# Patient Record
Sex: Female | Born: 1980 | Race: White | Hispanic: No | Marital: Single | Smoking: Never smoker
Health system: Southern US, Community
[De-identification: ages and names within clinical notes are randomized; demographics above are authoritative.]

## PROBLEM LIST (undated history)

## (undated) DIAGNOSIS — E039 Hypothyroidism, unspecified: Secondary | ICD-10-CM

## (undated) HISTORY — DX: Hypothyroidism, unspecified: E03.9

---
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
## 2009-02-23 ENCOUNTER — Ambulatory Visit: Payer: Self-pay | Admitting: Internal Medicine

## 2009-12-02 ENCOUNTER — Encounter: Admission: RE | Admit: 2009-12-02 | Discharge: 2009-12-02 | Payer: Self-pay | Admitting: Podiatrist

## 2010-05-29 ENCOUNTER — Encounter: Payer: Self-pay | Admitting: Podiatry

## 2010-06-17 ENCOUNTER — Encounter: Payer: Self-pay | Admitting: Podiatry

## 2011-12-11 ENCOUNTER — Ambulatory Visit (INDEPENDENT_AMBULATORY_CARE_PROVIDER_SITE_OTHER): Payer: BC Managed Care – PPO | Admitting: Sports Medicine

## 2011-12-11 ENCOUNTER — Encounter: Payer: Self-pay | Admitting: Sports Medicine

## 2011-12-11 VITALS — BP 113/72 | HR 64 | Ht 65.0 in | Wt 140.0 lb

## 2011-12-11 DIAGNOSIS — M25579 Pain in unspecified ankle and joints of unspecified foot: Secondary | ICD-10-CM

## 2011-12-11 DIAGNOSIS — M25572 Pain in left ankle and joints of left foot: Secondary | ICD-10-CM | POA: Insufficient documentation

## 2011-12-11 NOTE — Progress Notes (Signed)
31 year old female coming in with left ankle pain.  Patient is new to our clinic. Patient has had this left ankle pain for over 2 years. Patient states it is left-sided and appears to be on the anterior aspect of the ankle. Patient does not remember any specific injury to the area. Patient has seen multiple specialists for this previously. Patient had an MRI done September 2011. Patient's MRI was reviewed and showed that she had a mild tendinopathy over the anterior tibialis tendon otherwise unremarkable study. Patient since that time had been in physical therapy with moderate benefit, as well as putting custom orthotics. Patient states though since all these interventions, she has not had as much improvement she would like. Patient states she still unable to run and now even with her regular activities during the day she has difficulties. Patient denies that the pain is waking her up at night, denies any radiation of pain denies any knee pain. Patient also denies any numbness.    Review of systems as stated above in history of present illness otherwise 14 system review is done and unremarkable Past medical, surgical history is unremarkable. Social history: Patient does not smoke occasional alcohol. Allergies: Patient is allergic to penicillins Medications: No medications Family history: Patient's uncle on the maternal side has diabetes, and in her paternal grandfather had heart disease.  Physical exam Filed Vitals:   12/11/11 0905  BP: 113/72  Pulse: 64   General: No apparent distress alert and oriented x3 mood and affect are normal. Skin: Patient's warm intact no rash noted. Neuro: Patient is neurovascularly intact in all extremities and has 2+ DTRs. Left foot exam: On inspection patient does have swelling nonpitting over the anterior ankle. Patient has minimal tenderness to palpation over the anterior tibialis tendon as well as just laterally. Patient has no masses are palpated at this time.  Patient is neurovascularly intact distally. Patient has no pain over the medial or lateral malleolus and has full range of motion of the ankle. Patient has no pain over the fifth MTP or navicular bone. With standing patient's foot exam does show that she has some mild varus deformity of the hindfeet bilaterally right greater than left With walking patient does have some mild overpronation patient though with jogging does strike fairly neutral and is mostly midfoot runner.  Ultrasound was done and interpreted by me today. On ultrasound patient's anterior tibialis does show some hypoechoic changes surrounding the tendon near insertion as well as up to 2 cm proximal. Patient also has a pocket of fluid and seems to be surrounding the retinaculum and just lateral to the anterior tibialis. No true tear can be seen in the retinaculum. Patient also has some hypoechoic changes surrounding the extensor digitorum tendon.

## 2011-12-11 NOTE — Assessment & Plan Note (Addendum)
Patient on exam has what appears to be a anterior tibialis tendinopathy as well as likely a retinaculum injury. Patient does not have any true tears but does have hypoechoic changes on ultrasound and minimal swelling noted on physical exam. Patient will come out of the hard custom orthotics that she has and was put into sports insoles. Patient's hard custom orthotics also had a heel lift which I think is contributing to the worsening pain. In addition this patient was given a body helix compression sleeve for her left ankle. Patient is unaware this with any increase in activity. Patient given home exercise program to do a regular basis that includes acentric exercises for the ankle and calf. Patient can use anti-inflammatories over-the-counter as needed for pain control. Patient is also given a walk/jog protocol to help increase her to get her back to running which is one of her goals. Patient come back in 6 weeks' time for further evaluation. At that point she still is having some pain we should consider potentially doing a nitroglycerin patch.

## 2012-01-22 ENCOUNTER — Ambulatory Visit (INDEPENDENT_AMBULATORY_CARE_PROVIDER_SITE_OTHER): Payer: BC Managed Care – PPO | Admitting: Sports Medicine

## 2012-01-22 ENCOUNTER — Encounter: Payer: Self-pay | Admitting: Sports Medicine

## 2012-01-22 VITALS — BP 103/68 | HR 77

## 2012-01-22 DIAGNOSIS — M25572 Pain in left ankle and joints of left foot: Secondary | ICD-10-CM

## 2012-01-22 DIAGNOSIS — M25579 Pain in unspecified ankle and joints of unspecified foot: Secondary | ICD-10-CM

## 2012-01-22 MED ORDER — NITROGLYCERIN 0.2 MG/HR TD PT24: MEDICATED_PATCH | TRANSDERMAL | Status: DC

## 2012-01-22 NOTE — Patient Instructions (Addendum)
I am glad you are doing better.  We are going the right direction.  Continue to wear the compression when you can.  More important to wear it 2 hours after activity.  We will try the nitro patch.  Wear a quarter patch daily in the area of pain.  Be aware could give you headache and maybe a rash.  Try this and come back in 6 weeks.

## 2012-01-22 NOTE — Progress Notes (Signed)
Patient returns today for followup of her ankle pain. Patient at last visit did have an anterior tibialis tendinopathy as well as her retinaculum injury to the left ankle. Patient was given a compression sleeve, exercises and stretches to do, and a running protocol to increase her activity. Patient states that since that time she is about 50-60% better. She continues to improve but still has some soreness at the end of activity. Patient states that she is not having any swelling any more. Patient is not taking any over-the-counter pain medications. Patient denies any numbness or any new symptoms. Patient denies any nighttime awakenings. Patient is happy with the results of far but is frustrated because she would like to be completely pain-free.  No changes in past medical surgical family and social history.  14 system review is done and unremarkable to the orthopedic problem  Physical exam Blood pressure 103/68, pulse 77, last menstrual period 01/12/2012. General: No apparent distress alert and oriented x3 mood and affect normal Left foot exam: On inspection patient has no swelling. Patient has minimal tenderness to palpation over the anterior tibialis tendon as well as just laterally but improved since last visit. Patient's anterior tibialis tendon is now able to be seen in greater detail secondary to decreased effusion and this does appear to be hypertrophic compared to the contralateral side. Patient has no masses are palpated at this time. Patient is neurovascularly intact distally.  Patient has no pain over the medial or lateral malleolus and has full range of motion of the ankle. Patient has no pain over the fifth MTP or navicular bone.     Ultrasound was done and interpreted by me today. On ultrasound patient's anterior tibialis does show some hypoechoic changes surrounding the tendon near insertion as well as up to 2 cm proximal but significantly less than last time.. Patient also has a pocket  of fluid and seems to be surrounding the retinaculum. No true tear can be seen in the retinaculum. No fluid around the extensor digitorum. No true tears weren't neovascularization seen.

## 2012-01-22 NOTE — Assessment & Plan Note (Signed)
Patient has made some improvement at this time. Encourage her to continue to wear the compression sleeve as well as doing exercising on a regular basis. Patient though will be given a prescription for a nitroglycerin patch. Patient warned of potential side effects such as headache in contact dermatitis. Patient will try a quarter of patch daily until next followup. Patient will come back in 6 weeks' time for further evaluation and likely ultrasound to make sure we do have neovascularization. I am very optimistic at the time we see patient she should be about 80% her regular self.

## 2012-03-04 ENCOUNTER — Ambulatory Visit: Payer: BC Managed Care – PPO | Admitting: Sports Medicine

## 2012-04-08 ENCOUNTER — Ambulatory Visit (INDEPENDENT_AMBULATORY_CARE_PROVIDER_SITE_OTHER): Payer: BC Managed Care – PPO | Admitting: Sports Medicine

## 2012-04-08 VITALS — BP 115/77 | Ht 65.0 in | Wt 140.0 lb

## 2012-04-08 DIAGNOSIS — M25572 Pain in left ankle and joints of left foot: Secondary | ICD-10-CM

## 2012-04-08 DIAGNOSIS — M25579 Pain in unspecified ankle and joints of unspecified foot: Secondary | ICD-10-CM

## 2012-04-08 NOTE — Patient Instructions (Signed)
It is great to see you. I want you to increase her running per the protocol we gave you previously. Continue to wear the nitroglycerin patches but she can start going every other day if you would like. Continue to do the exercises at least 3 times a week. Come back in 6-8 weeks if you're not 100%.

## 2012-04-08 NOTE — Progress Notes (Signed)
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 Patient was last seen in November greater than 6 weeks ago. Patient states since that time she continues to improve and is approximately 80% better. Patient states that she has been doing a lot of traveling a lot walking and had no pain.  Patient has been wearing a nitroglycerin patch and can only wear it for 12 hours otherwise she starts to have itching in the area. Patient states though she has not had any headaches and feels like it hasn't helped out. Patient has been doing the exercises on a near daily basis and continues to wear the compression sleeve after exercising. Patient does not wear the compression sleeve during exercise secondary to some rubbing on the posterior aspect of her heel.   Patient has not been doing full running at this time but has started the protocol running for one to 2 minutes at a time and then walking for 4 and states that she has no significant pain.  Review of systems is done and unremarkable as related to the orthopedic problem.  Physical exam Blood pressure 115/77, height 5\' 5"  (1.651 m), weight 140 lb (63.504 kg). General: No apparent distress alert and oriented x3 Left foot exam: Patient has no gross deformity no swelling on inspection. Patient is nontender over the anterior tibialis tendon. Patient has full range of motion of the ankle in 5 out of 5 strength. She is neurovascularly intact distally. Patient has no pain over the fifth MTP or navicular bone. Patient has no pain over the lateral or medial malleolus either.  Ultrasound was done and interpreted by me today. On ultrasound patient anterior tibialis has minimal hypoechoic changes in patient's previous care is completely healed in the tendon as well as the retinaculum. This is significantly better than previous exam.

## 2012-04-08 NOTE — Assessment & Plan Note (Signed)
Patient at this time is completely healed and can increase her training. Discussed proper protocol to increase regimen slowly. He Patient has been on a nitroglycerin patch for the last 2 months and will come off of it over the course of the next 2-4 weeks. Patient will also continue to do maintenance therapy for the ankle with doing exercises at least 3 times a week. Patient and will followup in 8 weeks if for some reason she is not able to be at full-strength at that time.

## 2012-05-27 ENCOUNTER — Ambulatory Visit (INDEPENDENT_AMBULATORY_CARE_PROVIDER_SITE_OTHER): Payer: BC Managed Care – PPO | Admitting: Sports Medicine

## 2012-05-27 VITALS — BP 112/70 | Ht 65.0 in | Wt 140.0 lb

## 2012-05-27 DIAGNOSIS — M25579 Pain in unspecified ankle and joints of unspecified foot: Secondary | ICD-10-CM

## 2012-05-27 DIAGNOSIS — S46392A Other injury of muscle, fascia and tendon of triceps, left arm, initial encounter: Secondary | ICD-10-CM | POA: Insufficient documentation

## 2012-05-27 DIAGNOSIS — S4980XA Other specified injuries of shoulder and upper arm, unspecified arm, initial encounter: Secondary | ICD-10-CM

## 2012-05-27 DIAGNOSIS — M25572 Pain in left ankle and joints of left foot: Secondary | ICD-10-CM

## 2012-05-27 NOTE — Progress Notes (Signed)
Chief complaint: Left ankle pain and left elbow pain  History of present illness: The patient is here for followup of her left ankle. Patient did have anterior tibialis tendinopathy. Patient has been doing nitroglycerin patches, compression sleeve, icing and home exercise program. Patient states that she is doing significantly better at this time. Patient would say she is approximately 95% better. Patient states that she has no pain with any activity but it usually is the day afterwards when she feels discomfort. Patient denies any numbness tingling or any nighttime awakening. Patient is very happy with her progress so far.  Patient is coming in with a new problem  of left elbow pain. Patient states approximately one month ago she started doing some kettlebell exercises. Patient states that she's been having some pain in the posterior aspect of her elbow.  Patient's sister who is a physical therapist said it could be tennis elbow. Patient has not tried any thing differently except using a tennis elbow air strap but has started to change her training protocol. Patient notices that pushups seem to hurt a lot and if she sits with her arms in raised position such as at her desk for a long amount of time she can have some soreness in this area. Patient denies any swelling any bruising in the area. The patient denies any clicking popping or locking on her. Patient denies any radiation of pain or any numbness. Patient describes the pain more as a dull aching sensation.  Past medical history, social, surgical and family history all reviewed.   14 System review was done and unremarkable as related to the chief complaint.   Physical exam Blood pressure 112/70, height 5\' 5"  (1.651 m), weight 140 lb (63.504 kg). General: No apparent distress alert and oriented x3 Left foot exam: Patient has no gross deformity no swelling on inspection. Patient is nontender over the anterior tibialis tendon. Patient has full range  of motion of the ankle in 5 out of 5 strength. She is neurovascularly intact distally. Patient has no pain over the fifth MTP or navicular bone. Patient has no pain over the lateral or medial malleolus either.  Left elbow exam: Patient has mild tenderness to palpation over the posterior aspect of the elbow at the insertion of the triceps. There is no gapping palpated or any type of a fusion. Patient has full range of motion and full 5 out of 5 strength. She is neurovascularly intact distally with  full grip strength.  Musculoskeletal ultrasound was performed interpreted by me today. Patient's ultrasound did show some hypoechoic changes with mild neovascularization of the tendon of the long head of the triceps. There is no true tearing and only sees more of this hypoechoic change between the 3 muscle heads secondary to be fluid. There was no dynamic gapping on range of motion. Some increased blood flow in region.

## 2012-05-27 NOTE — Assessment & Plan Note (Addendum)
The patient has what appears to be splitting between the heads of the tricep. I do not think she has a true tear of the triceps tendon but legitimately get one if we push it too hard. The patient was given a home exercise program focusing on triceps extensions with weights between 2 and 5 pounds in multiple directions. Told her to avoid full body weight exercises such as pushups or body dips. Patient given a compression sleeve today and will try the nitroglycerin patch over the area. Patient return again in 6 weeks for further evaluation.

## 2012-05-27 NOTE — Assessment & Plan Note (Signed)
Seems to be completely resolved at this time. Encouraged patient to continue the compression sleeve.  Continue doing home exercise program 3 times a week. Encouraged patient to continue to cross train though and try to avoid too much running at any one time. At this point patient is cleared to do any activity she would like and can followup as needed for this problem.

## 2012-05-27 NOTE — Patient Instructions (Signed)
Good to see you We want you to try to use the nitro patch on your tricep tendon.   Try doing kickback with 3-5# weights in neutral, underhand and overhand positions Also do modified push ups but avoid using your full body weight. Try the compression sleeve at least after activity.  Icing 20 minutes 3 times a day.  Lets have you come back again in 6 weeks to make sure you doing doing.

## 2012-07-09 ENCOUNTER — Encounter: Payer: Self-pay | Admitting: Sports Medicine

## 2012-07-09 ENCOUNTER — Ambulatory Visit (INDEPENDENT_AMBULATORY_CARE_PROVIDER_SITE_OTHER): Payer: BC Managed Care – PPO | Admitting: Sports Medicine

## 2012-07-09 VITALS — BP 112/69 | HR 80 | Ht 65.0 in | Wt 140.0 lb

## 2012-07-09 DIAGNOSIS — M25572 Pain in left ankle and joints of left foot: Secondary | ICD-10-CM

## 2012-07-09 DIAGNOSIS — S46392A Other injury of muscle, fascia and tendon of triceps, left arm, initial encounter: Secondary | ICD-10-CM

## 2012-07-09 DIAGNOSIS — M25579 Pain in unspecified ankle and joints of unspecified foot: Secondary | ICD-10-CM

## 2012-07-09 DIAGNOSIS — S4980XA Other specified injuries of shoulder and upper arm, unspecified arm, initial encounter: Secondary | ICD-10-CM

## 2012-07-09 NOTE — Assessment & Plan Note (Signed)
Insertional triceps tendinitis much improved.  Advance activities as tolerated

## 2012-07-09 NOTE — Assessment & Plan Note (Signed)
Some posterior tibialis tendinopathy.  No sign of stress fracture Plan: Continue compression. Advance running as noted in instruction.  Followup as needed

## 2012-07-09 NOTE — Progress Notes (Signed)
Tammy [name] is a 32 y.o. female who presents to [facility] today for followup left ankle and left elbow.  Left elbow: Patient was seen approximately one month ago for injury to her triceps insertion in her left olecranon. This occurred while doing kettlebell exercises. In the interim she has been using resistance training compression ice. She has very little elbow pain. She has not yet started doing heavy weight lifting yet but feels well otherwise.  Left ankle: Initially diagnosed with anterior tibialis and subsequently also posterior tibialis pain. This is done well with nitroglycerin protocol compression and graduated return to running. She is now running 4 miles walking 1 mile. Her goal is to be on to run a 5K races again. She notes some mild pain at the medial malleolus. Her pain is located approximately 2 cm proximal to the medial malleolus and 1 cm anterior to the posterior edge.   She notes mild pain following running. She denies any swelling or bruising. She feels well otherwise.    PMH reviewed.  History  Substance Use Topics  . Smoking status: Never Smoker   . Smokeless tobacco: Never Used  . Alcohol Use: Not on file   ROS as above otherwise neg   Exam:  BP 112/69  Pulse 80  Ht 5\' 5"  (1.651 m)  Wt 140 lb (63.504 kg)  BMI 23.3 kg/m2 Gen: Well NAD MSK: Left elbow: Normal-appearing nontender. Normal range of motion and strength. Pulses capillary refill sensation intact distally Left ankle: Normal-appearing no swelling or ecchymosis. Mildly tender just superior to the medial malleolus. Normal motion strength. Normal capillary refill and pulses distally.   Limited musculoskeletal ultrasound of the left ankle: Mild fluid surrounding the posterior tibialis and flexor digitorum tendons at the corner of the malleolus. Just superior at the start of the posterior tibialis muscle belly there is some fluid tracking anteriorly across the distal tibia. There is no cortex irregularity or increased  Doppler activity indicating stress fracture.

## 2012-07-09 NOTE — Patient Instructions (Addendum)
Thank you for coming in today. Continue the exercises you have been doing.  Advance your run/walk program to 10:2 and then 15:2 and then 30:0 over a course of weeks.  Continue the elbow exercises.  If you go back to heavier weights use about 50% of the weight you were doing and advance slowly.  Return as needed as long as you are doing well.

## 2012-12-02 DIAGNOSIS — R140 Abdominal distension (gaseous): Secondary | ICD-10-CM | POA: Insufficient documentation

## 2013-09-23 ENCOUNTER — Encounter: Payer: Self-pay | Admitting: Podiatry

## 2013-10-04 ENCOUNTER — Encounter: Payer: Self-pay | Admitting: Podiatry

## 2013-10-04 ENCOUNTER — Ambulatory Visit (INDEPENDENT_AMBULATORY_CARE_PROVIDER_SITE_OTHER): Payer: BC Managed Care – PPO | Admitting: Podiatry

## 2013-10-04 VITALS — BP 113/72 | HR 65 | Resp 16 | Ht 65.0 in | Wt 150.0 lb

## 2013-10-04 DIAGNOSIS — Q828 Other specified congenital malformations of skin: Secondary | ICD-10-CM

## 2013-10-04 NOTE — Progress Notes (Signed)
She presents today chief complaint of painful lesions plantar aspect of the bilateral foot.  Objective: Vital signs are stable she is alert and oriented x3 pulses are palpable bilateral. Upon evaluation of the bilateral foot she does demonstrates solitary porokeratosis just proximal to the second metatarsal head bilaterally. There is no erythema edema cellulitis drainage or odor.  Assessment: Porokeratosis bilateral plantar foot.  Plan: Sharp debridement of these lesions today we'll followup with her as needed.

## 2013-10-05 ENCOUNTER — Telehealth: Payer: Self-pay | Admitting: *Deleted

## 2013-10-05 NOTE — Telephone Encounter (Signed)
Pt called stated she was in yesterday and she showed you a small raised place on the base of her middle toe rt foot. She wants to know if the place will go away on its own, is there anything that can be done with it or let it be?

## 2013-11-01 ENCOUNTER — Telehealth: Payer: Self-pay | Admitting: *Deleted

## 2013-11-01 NOTE — Telephone Encounter (Signed)
Called and left message on pts voicemail regarding a raised place on the base of her middle toe rt foot. Per dr Milinda Pointer: let it be, nothing to do about place on toe.

## 2014-01-12 ENCOUNTER — Ambulatory Visit: Payer: Self-pay | Admitting: General Practice

## 2014-02-16 ENCOUNTER — Encounter: Payer: Self-pay | Admitting: Nurse Practitioner

## 2014-03-18 ENCOUNTER — Encounter: Payer: Self-pay | Admitting: Nurse Practitioner

## 2014-04-18 ENCOUNTER — Encounter: Payer: Self-pay | Admitting: Nurse Practitioner

## 2014-05-17 ENCOUNTER — Encounter
Admit: 2014-05-17 | Disposition: A | Discharge status: Home / Self Care | Payer: Self-pay | Attending: Nurse Practitioner | Admitting: Nurse Practitioner

## 2014-06-17 ENCOUNTER — Encounter
Admit: 2014-06-17 | Disposition: A | Discharge status: Home / Self Care | Payer: Self-pay | Attending: Nurse Practitioner | Admitting: Nurse Practitioner

## 2014-08-04 ENCOUNTER — Ambulatory Visit (INDEPENDENT_AMBULATORY_CARE_PROVIDER_SITE_OTHER): Payer: BLUE CROSS/BLUE SHIELD | Admitting: Sports Medicine

## 2014-08-04 ENCOUNTER — Encounter: Payer: Self-pay | Admitting: Sports Medicine

## 2014-08-04 VITALS — BP 107/76 | Ht 65.0 in | Wt 160.0 lb

## 2014-08-04 DIAGNOSIS — M25572 Pain in left ankle and joints of left foot: Secondary | ICD-10-CM | POA: Diagnosis not present

## 2014-08-04 MED ORDER — AMITRIPTYLINE HCL 25 MG PO TABS: 25.0000 mg | ORAL_TABLET | Freq: Every day | ORAL | Status: DC

## 2014-08-04 NOTE — Assessment & Plan Note (Signed)
Unclear etiology of exact cause of her pain and swelling. 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  Concerns for potential CRPS/RSD given the intermittent and findings.  Also consider some underlying lymphatic abnormalities such as lymphedema praecox.  She has tolerated nitroglycerin in the past without significant improvement however Ultrasound does show significant subcutaneous edema with no focal abnormalities over the anterior tibialis, posterior tibialis or anterior talar dome. Start amitriptyline daily at bedtime Continue compression with body helix in consideration of compression socks. Follow up 4 weeks to see how she is doing.  > Consider additional arch support and cushioned insoles as her cushioned shoes are the most comfortable.  > Consider further evaluation of lumbar spine if no significant improvement

## 2014-08-04 NOTE — Progress Notes (Signed)
Tammy [name] - 34 y.o. female MRN [id number]  Date of birth: 04-22-1980  SUBJECTIVE: CC: 1.  left ankle pain and swelling, reevaluation      HPI:   long-standing history of significant pain and swelling. Present now for 2 months. Intermittently comes and goes. Swelling does seem to be worse at the end of the day. Typically only on the left leg. No right leg symptoms. Pain is focally located over the anterior joint line and over the anterior tibialis tendon. No skin changes or erythema associated with this. Denies fevers, chills or recent weight gain or weight loss.  His been previously thoroughly evaluated at podiatry here as well as by vascular specialist. Recommended to wear compression socks to help with some of the swelling she is having but no further intervention recommendations given.  As previously warned custom orthotics which were hard and quite uncomfortable for her.  Prior SCFE as a child and status post pinning of the left hip.      ROS:  per HPI    HISTORY:  Past Medical, Surgical, Social, and Family History reviewed & updated per EMR.  Pertinent Historical Findings include: Social History   Occupational History  . Not on file.   Social History Main Topics  . Smoking status: Never Smoker   . Smokeless tobacco: Never Used  . Alcohol Use: Yes     Comment: social  . Drug Use: No  . Sexual Activity: Not on file    No specialty comments available. Problem  Left Ankle Pain   Anterior tibialis tendinopathy. Questionable retinacular injury initially seen. Intermittent and long-lasting swelling of the entire left leg now. His been seen by vascular surgery and recommended to wear compression socks Using a body helix with some improvement but she seems to do well for prolonged periods of time and then will have an acute flareup that persists for months in spite of no significant changes in her activity level MRI left ankle, no contrast 12/02/2009: Mild tendinopathy and  tenosynovitis of the anterior tibialis. Otherwise ligamentously and tennis leg intact. No intra-articular findings noted.    OBJECTIVE:  VS:   HT:5\' 5"  (165.1 cm)   WT:160 lb (72.576 kg)  BMI:26.7          BP:107/76 mmHg  HR: bpm  TEMP: ( )  RESP:   PHYSICAL EXAM: GENERAL:  adult Caucasian female. No acute distress  PSYCH: Alert and appropriately interactive.  SKIN: No open skin lesions or abnormal skin markings on areas inspected as below  VASCULAR:  2+/4 pretibial pitting edema left, normal on the right. DP and PT pulses 2+/4. No significant pallor or skin changes.   NEURO: Lower extremity strength is 5+/5 in all myotomes; sensation is intact to light touch in all dermatomes.  LEFT ANKLE: Overall well aligned. No significant deformity. No marked tenderness to palpation today although slight tenderness over the anterior medial joint line/anterior tibialis tendon. No posterior tibialis tendon. Negative ankle drawer, talar tilt, Klieger, cotton testing. Squeeze test is normal. Dorsiflexion to 95. Small Morton's callus. LEFT HIP: Equal and symmetric internal and external range of motion. Extensor mechanism strength 5+, hip abduction strength 5 minus.  DATA OBTAINED: No notes on file    Limited MSK Ultrasound of left ankle: Findings:  marked subcutaneous edema. No significant changes within the anterior tibialis, posterior tibialis, talar dome, retinaculum. Small amount of supraphysiologic fluid around the anterior tibialis at the area of the retinaculum although only minimal. No tissue disruption, no increased neovascularity.  Impression: The above findings are consistent with subcutaneous edema with minimal anterior tibialis tendinopathy.      ASSESSMENT & PLAN: See problem based charting & AVS for additional documentation Problem List Items Addressed This Visit    Left ankle pain - Primary    Unclear etiology of exact cause of her pain and swelling. 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  Concerns for potential CRPS/RSD given the intermittent and findings.  Also consider some underlying lymphatic abnormalities such as lymphedema praecox.  She has tolerated nitroglycerin in the past without significant improvement however Ultrasound does show significant subcutaneous edema with no focal abnormalities over the anterior tibialis, posterior tibialis or anterior talar dome. Start amitriptyline daily at bedtime Continue compression with body helix in consideration of compression socks. Follow up 4 weeks to see how she is doing.  > Consider additional arch support and cushioned insoles as her cushioned shoes are the most comfortable.  > Consider further evaluation of lumbar spine if no significant improvement         FOLLOW UP:  Return in about 4 weeks (around 09/01/2014).

## 2014-08-04 NOTE — Patient Instructions (Signed)
Please start the amitriptyline at bedtime. Continue using her body helix, consider compression socks Heel and toe walking 2-3 times per day  Complex Regional Pain Syndrome Complex Regional Pain Syndrome (CRPS) is a nerve disorder that occurs at the site of an injury. It occurs especially after injuries from high-velocity impacts such as those from bullets or shrapnel. However, it may occur without apparent injury. The arms or legs are usually involved. SYMPTOMS  CRPS is a chronic condition characterized by:  Severe burning pain.  Changes in bone and skin.  Excessive sweating.  Tissue swelling.  Extreme sensitivity to touch. One visible sign of CRPS near the site of injury is warm, shiny, red skin that later becomes cool and bluish. The pain that patients report is out of proportion to the severity of the injury. The pain gets worse, rather than better, over time. Eventually the joints become stiff from disuse and the skin, muscles, and bone atrophy. The symptoms of CRPS vary in severity and duration. The cause of CRPS is unknown. The disorder is unique in that it simultaneously affects the:  Nerves.  Skin.  Muscles.  Blood vessels.  Bones. CRPS can strike at any age but is more common between the ages of 63 and 62. However, the number of CRPS cases among adolescents and young adults is increasing. CRPS is diagnosed primarily through observation of the symptoms. Some physicians use thermography to detect changes in body temperature that are common in CRPS. X-rays may also show changes in the bone.  TREATMENT  Treatments include:  Physicians use a variety of drugs to treat CRPS.  Elevation of the extremity and physical therapy are also used to treat CRPS.  Injection of a local anesthetic.  Applying brief pulses of electricity to nerve endings under the skin (transcutaneous electrical stimulation or TENS).  In some cases, interruption of the affected portion of the sympathetic  nervous system (surgical or chemical sympathectomy) is necessary to relieve pain. This involves cutting the nerve or nerves. Pain is destroyed almost instantly, but this treatment may also destroy other sensations. PROGNOSIS Good progress can be made in treating CRPS if treatment is begun early. Ideally treatment should begin within 3 months of the first symptoms. Early treatment often results in remission. If treatment is delayed, the disorder can quickly spread to the entire limb, and changes in bone and muscle may become irreversible. In 50 percent of CRPS cases, pain persists longer than 6 months and sometimes for years. Document Released: 02/22/2002 Document Revised: 05/27/2011 Document Reviewed: 01/13/2008 [facility] Patient Information 2015 [place], [place]. This information is not intended to replace advice given to you by your health care provider. Make sure you discuss any questions you have with your health care provider.

## 2014-08-05 ENCOUNTER — Encounter: Payer: Self-pay | Admitting: Sports Medicine

## 2014-08-17 ENCOUNTER — Telehealth: Payer: Self-pay | Admitting: *Deleted

## 2014-08-17 MED ORDER — GABAPENTIN 300 MG PO CAPS: 300.0000 mg | ORAL_CAPSULE | Freq: Every day | ORAL | Status: DC

## 2014-08-17 NOTE — Telephone Encounter (Signed)
Per draper, lets d/c the Amitriptyline and start gabapentin 300mg  qhs. Pt will continue to wear the bodyhelix sleeve daily. (she admits to not wearing it every day). Pt will call Tuesday when fields returns ONLY if the gabapentin isnt working better than the Amitriptyline and symptoms are the same

## 2014-09-01 ENCOUNTER — Telehealth: Payer: Self-pay | Admitting: *Deleted

## 2014-09-01 NOTE — Telephone Encounter (Signed)
Pt called with update, she states there has been no changes, still having left ankle swelling. Gabapentin is working better than amitriptyline. Her appt is next Thursday

## 2014-09-08 ENCOUNTER — Encounter: Payer: Self-pay | Admitting: Sports Medicine

## 2014-09-08 ENCOUNTER — Ambulatory Visit (INDEPENDENT_AMBULATORY_CARE_PROVIDER_SITE_OTHER): Payer: BLUE CROSS/BLUE SHIELD | Admitting: Sports Medicine

## 2014-09-08 VITALS — BP 104/69 | HR 74 | Ht 65.0 in | Wt 160.0 lb

## 2014-09-08 DIAGNOSIS — M25561 Pain in right knee: Secondary | ICD-10-CM | POA: Diagnosis not present

## 2014-09-08 DIAGNOSIS — M25572 Pain in left ankle and joints of left foot: Secondary | ICD-10-CM

## 2014-09-08 MED ORDER — NITROGLYCERIN 0.2 MG/HR TD PT24: MEDICATED_PATCH | TRANSDERMAL | Status: DC

## 2014-09-08 NOTE — Assessment & Plan Note (Signed)
Likely due to slight movement of patella across PLICA with tenderness over medial PLICA Disucssed HEP F/u 6 weeks

## 2014-09-08 NOTE — Patient Instructions (Addendum)
Great to meet you!  Continue your heal exercises, start the nitroglycerin  Nitroglycerin Protocol   Apply 1/4 nitroglycerin patch to affected area daily.  Change position of patch within the affected area every 24 hours.  You may experience a headache during the first 1-2 weeks of using the patch, these should subside.  If you experience headaches after beginning nitroglycerin patch treatment, you may take your preferred over the counter pain reliever.  Another side effect of the nitroglycerin patch is skin irritation or rash related to patch adhesive.  Please notify our office if you develop more severe headaches or rash, and stop the patch.  Tendon healing with nitroglycerin patch may require 12 to 24 weeks depending on the extent of injury.  Men should not use if taking Viagra, Cialis, or Levitra.   Do not use if you have migraines or rosacea.    Try these new exercises for your R knee  Approximately 10 reps, 3 sets per day Seated- squeezing ball and extend lower leg slowly On your R side- R sided inside leg lift On your back- R sided straight leg raises with your foot externally rotated  Come back in 6 weeks

## 2014-09-08 NOTE — Assessment & Plan Note (Signed)
Etiology unclear and likely multifactorial with impaired lymphatic drainage as evidenced by fluid outside the joint capsule on [place] and tendinopathy from an old injury.  Continue compression and HEP Start NTG and return in 6 weeks for f/u

## 2014-09-08 NOTE — Progress Notes (Signed)
Patient ID: Tammy [name], female   DOB: [birth year], 34 y.o.   MRN: [id number]   HPI  Patient presents today for f/u L ankle pain and new R knee pain  L ankle Notes she had an original injury in 2011, she was seen here in 2013 and treated with NTG, NSAIDs, HEP, and compression with good resolution.   It then returned about 3 months ago. She was seen here and US showed subq edema and minimal anterior tib tendinopathy. She was tretaed with compression and gabapentin then. The gabapentin isnt helping but the compression definitely helps the swelling and pain.   Her swelling and pain has continued, unchanged but clearly helped by compression. She has been doing her HEP. She has not tried the compression socks.   She notes L ankle pain located at the anterior ankle and radiating up to lower 1/3 of her shin. It's worse with swelling which is worse with activity.   R knee pain Noticed medial knee popping going up stairs for some time but it has been bothersome for about 2 months with mild achy medial knee pain. She denies injury, swelling, and erythema. She denies locking symptoms.   PMH: Smoking status noted ROS: Per HPI  Objective: BP 104/69 mmHg  Pulse 74  Ht 5\' 5"  (1.651 m)  Wt 160 lb (72.576 kg)  BMI 26.63 kg/m2 Gen: NAD, alert, cooperative with exam HEENT: NCAT Ext: No edema, warm Neuro: Alert and oriented, No gross deficits  MSK: R knee without erythema, effusion, bruising, or gross deformity No joint line tenderness.  ligamentously intact to Lachman's and with varus and valgus stress.  Negative McMurray's test Some tenderness over medial PLICA with patellar movement  L Ankle: Generalized, non pitting edema No erythema Mild tenderenss to palption of R anterior ankle and up to 1/3 shin Mild tenderess with full dorsal and plantar flexion.  No joint laxity, no tenderness to palpation of medial/lateral ligaments  MSK [place] There is soft tissue swelling that extends from the left  ankle up two thirds of the way in the left shin There is no joint effusion Posterior tibial and peroneal tendons are intact Anterior tibialis tendon does have a slight amount of fluid over the distal insertion to the tarsal bones  knee scan did not reveal any significant abnormalities   Assessment and plan:  Right medial knee pain Likely due to slight movement of patella across PLICA with tenderness over medial PLICA Disucssed HEP F/u 6 weeks  Left ankle pain Etiology unclear and likely multifactorial with impaired lymphatic drainage as evidenced by fluid outside the joint capsule on [place] and tendinopathy from an old injury.  Continue compression and HEP Start NTG and return in 6 weeks for f/u     Meds ordered this encounter  Medications  . nitroGLYCERIN (NITRODUR - DOSED IN MG/24 HR) 0.2 mg/hr patch    Sig: Place 1/4 patch to affected area daily.    Dispense:  30 patch    Refill:  1

## 2014-10-26 ENCOUNTER — Ambulatory Visit (INDEPENDENT_AMBULATORY_CARE_PROVIDER_SITE_OTHER): Payer: BLUE CROSS/BLUE SHIELD | Admitting: Sports Medicine

## 2014-10-26 ENCOUNTER — Encounter: Payer: Self-pay | Admitting: Sports Medicine

## 2014-10-26 VITALS — BP 99/67 | Ht 65.0 in | Wt 160.0 lb

## 2014-10-26 DIAGNOSIS — M25572 Pain in left ankle and joints of left foot: Secondary | ICD-10-CM | POA: Diagnosis not present

## 2014-10-26 DIAGNOSIS — M25561 Pain in right knee: Secondary | ICD-10-CM

## 2014-10-26 NOTE — Progress Notes (Signed)
Tammy [name] - 34 y.o. female MRN [id number]  Date of birth: 09-11-1980  CC: No chief complaint on file.   SUBJECTIVE:   HPI  Acute on chronic left ankle discomfort:  - initial injury in 2011, present to [facility] in 2013 when she found good resolution with NTG, NSAIDs, HEP, and compression - Seen in March 2016 and US showed subq edema and minimal anterior tib tendinopathy.  - f/u 6 weeks ago made several changes to regimen. - Wearing body helix daily since March when Left Ankle pain returned - Has started using the 1/4 nitroglycerin patch and stopped the gabapentin    - She has improved and is able to walk without pain    - Still having quite a bit of pain with long days of walking. 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 Feels a bit stronger - Still clicking, but this is more uncomfortable than painful - Clicking mostly with going up and down stairs - Mostly better overall.      ROS:     14 point RoS negative pertaining to MSK issues.   HISTORY: Past Medical, Surgical, Social, and Family History Reviewed & Updated per EMR.    OBJECTIVE: BP 99/67 mmHg  Physical Exam  Gen: NAD, alert, cooperative with exam HEENT: NCAT Ext: No edema, warm Neuro: Alert and oriented, No gross deficits  MSK: R knee again without erythema, effusion, bruising, or gross deformity No joint line tenderness.  ligamentously intact to Lachman's and with varus and valgus stress.  Negative McMurray's test No tenderness over medial PLICA with patellar movement  L Ankle: Generalized, minimal non-pitting edema equal bilaterally No erythema Mild tenderenss to palption of R anterior ankle from anterior border of medial malleolus to Extensor digitorum tendon extending  proximally 1/3 of the way up the shin. Mild tenderess with full dorsal and plantar flexion.  No joint laxity, no tenderness to palpation of medial/lateral ligaments. Normal talar tilt and anterior drawer.  MSK U/s:  There is no joint effusion, but there is soft tissue swelling near the anterior tibialis tendon as it crosses over her left ankle joint. The swelling is also present over the distal tibia.     MEDICATIONS, LABS & OTHER ORDERS: Previous Medications   ACETYLCYSTEINE 600 MG CAPS    Take by mouth.   ACZONE 5 % TOPICAL GEL    Apply topically Daily.   ADAPALENE (DIFFERIN) 0.1 % CREAM       ARMOUR THYROID 15 MG TABLET       ARMOUR THYROID 60 MG TABLET    Take 60 mg by mouth daily.   CHOLECALCIFEROL (VITAMIN D PO)    Take 1 tablet by mouth daily.   CLINDAMYCIN PHOS-BENZOYL PEROX 1.2-3.75 % GEL       DAPSONE 5 % TOPICAL GEL    Apply topically.   DHA-EPA-VITAMIN E PO    Take by mouth.   MULTIPLE VITAMIN (MULTIVITAMIN) TABLET    Take 1 tablet by mouth daily.   NITROGLYCERIN (NITRODUR - DOSED IN MG/24 HR) 0.2 MG/HR PATCH    Place 1/4 patch to affected area daily.   TRETINOIN (ALTRALIN) 0.05 % GEL       TRI-PREVIFEM 0.18/0.215/0.25 MG-35  MCG TABLET    Take 1 tablet by mouth daily.   Modified Medications   No medications on file   New Prescriptions   No medications on file   Discontinued Medications   No medications on file  No orders of the defined types were placed in this encounter.   ASSESSMENT & PLAN: See problem based charting & AVS for pt instructions.

## 2014-10-26 NOTE — Patient Instructions (Addendum)
Left ankle:This is likely lymphatic in origin.   - Try to do toe walking and heel walking several times a day.  - Continue easy ankle motion.  - You can start doing ankle balancing exercises if heel/toe walking goes well - Continue to use 1/4 patch of nitroglycerin daily until your f/u  - Continue to use your bodyhelix ankle strap.  - Continue to limit NSAIDs if possible.  - You may also try taking 1gm of vitamin C daily.  - f/u in 6 weeks  Right Knee Pain: Greatly improved with Quad strengthening exercises - Continue with previously prescribed exercises including: Approximately 10 reps, 3 sets per day Seated- squeezing ball and extend lower leg slowly On your R side- R sided inside leg lift On your back- R sided straight leg raises with your foot externally rotated

## 2014-10-27 ENCOUNTER — Ambulatory Visit: Payer: BLUE CROSS/BLUE SHIELD | Admitting: Sports Medicine

## 2014-10-27 NOTE — Assessment & Plan Note (Signed)
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  - Intermittent and long-lasting swelling of the entire left leg now. - MRI left ankle, non contrast 12/02/2009: Mild tendinopathy and tenosynovitis of the anterior tibialis. Otherwise ligamentously and tendons intact. No intra-articular findings noted. - Has been seen by vascular surgery and recommended to wear compression socks. She wears her body helix with good results.  - Did not tolerate gabapentin.  - Try to do toe and heel walking several times a day.  - Continue with easy ankle RoM.  - May start doing ankle balancing exercises if heel/toe walking goes well  - Continue to use 1/4 patch of nitroglycerin daily as this has provided good results - Continue to use your bodyhelix ankle strap.  - Continue to limit NSAIDs if possible.  - May also try taking 1gm of vitamin C daily.  - f/u in 6 weeks

## 2014-12-14 ENCOUNTER — Ambulatory Visit: Payer: BLUE CROSS/BLUE SHIELD | Admitting: Sports Medicine

## 2014-12-21 ENCOUNTER — Ambulatory Visit (INDEPENDENT_AMBULATORY_CARE_PROVIDER_SITE_OTHER): Payer: BLUE CROSS/BLUE SHIELD | Admitting: Sports Medicine

## 2014-12-21 ENCOUNTER — Encounter: Payer: Self-pay | Admitting: Sports Medicine

## 2014-12-21 VITALS — BP 99/70

## 2014-12-21 DIAGNOSIS — M25572 Pain in left ankle and joints of left foot: Secondary | ICD-10-CM | POA: Diagnosis not present

## 2014-12-21 NOTE — Progress Notes (Signed)
  Tammy [name] - 34 y.o. female MRN [id number]  Date of birth: [birth year]  CC: Recurrent ankle pain.   SUBJECTIVE:   HPI  Acute on chronic left ankle discomfort: Overall much improved.  - Swelling definitely better, even without the [place] brace now. May be linked to supplements as she was taking multiple supplements (which she didn't disclose) and noticed an improvement when she stopped.  - Heel/toe walking most days.  Only 12 steps forward and backward.  - Also started leg balance exercises for balance training after neck injury.  - continuing 1/4 patch of Nitro - No longer taking 1 gm vitamin C. Only Vitamin D.  - No walking or other exercise. - Seen at outside facility and they suggested possibly a tendon sheath injection.  - No catching.      ROS:     14 poin RoS negative pertaining to MSK issues.   HISTORY: Past Medical, Surgical, Social, and Family History Reviewed & Updated per EMR.   OBJECTIVE: BP 99/70 mmHg  Physical Exam  Gen: NAD, alert, cooperative with exam HEENT: NCAT Ext: No edema, warm Neuro: Alert and oriented, No gross deficits  L Ankle: Generalized, minimal non-pitting edema equal bilaterally in lower leg, but not really around ankle.  No erythema Mild tenderenss to palption of R anterior ankle from anterior border of medial malleolus to Extensor digitorum tendon extending proximally 1/3 of the way up the shin. No  tenderess with full dorsal and plantar flexion.  No joint laxity, no tenderness to palpation of medial/lateral ligaments. Normal talar tilt and anterior drawer.   MEDICATIONS, LABS & OTHER ORDERS: Previous Medications   ACZONE 5 % TOPICAL GEL    Apply topically Daily.   ADAPALENE (DIFFERIN) 0.1 % CREAM       ADAPALENE (DIFFERIN) 0.1 % CREAM       ARMOUR THYROID 15 MG TABLET       ARMOUR THYROID 60 MG TABLET    Take 60 mg by mouth daily.   CHOLECALCIFEROL (VITAMIN D PO)    Take 1 tablet by mouth daily.   CLOTRIMAZOLE-BETAMETHASONE (LOTRISONE)  CREAM    APPLY 1 A SMALL AMOUNT TO SKIN TWICE A DAY APPLY SPARINGLY TO AFFECTED AREA TWICE DAILY FOR 14 DAYS   DAPSONE 5 % TOPICAL GEL    Apply topically.   DAPSONE 5 % TOPICAL GEL    Apply topically.   NITROGLYCERIN (NITRODUR - DOSED IN MG/24 HR) 0.2 MG/HR PATCH    Place 1/4 patch to affected area daily.   NITROGLYCERIN (NITRODUR - DOSED IN MG/24 HR) 0.2 MG/HR PATCH    Place 1/4 patch to affected area daily.   TRETINOIN (ALTRALIN) 0.05 % GEL       TRETINOIN (ALTRALIN) 0.05 % GEL       TRI-PREVIFEM 0.18/0.215/0.25 MG-35 MCG TABLET    Take 1 tablet by mouth daily.   Modified Medications   No medications on file   New Prescriptions   No medications on file   Discontinued Medications   ACETYLCYSTEINE 600 MG CAPS    Take by mouth.   CLINDAMYCIN PHOS-BENZOYL PEROX 1.2-3.75 % GEL       DHA-EPA-VITAMIN E PO    Take by mouth.   MULTIPLE VITAMIN (MULTIVITAMIN) TABLET    Take 1 tablet by mouth daily.  No orders of the defined types were placed in this encounter.   ASSESSMENT & PLAN: See problem based charting & AVS for pt instructions.

## 2014-12-22 NOTE — Assessment & Plan Note (Signed)
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 This may be partially linked to supplement use. She did not know the supplements she was previously taking, but has noticed improvement since ceasing to use them.  - Intermittent swelling of the Left lower leg has resolved.  Still with some b/l LE edema (trace). No compression socks. Occasionally wears ankle compression brace, but has not needed recently.  - Try to do toe and heel walking several times a day. She will try to do more as she is only doing 12 steps at a time.  - Continue with easy ankle RoM. May consider beginning more exercises.   - Continue to use 1/4 patch of nitroglycerin daily as this has provided good results. Started 09/08/2014.  - May also try taking 1gm of vitamin C daily.  - f/u in 6 weeks

## 2015-01-03 ENCOUNTER — Other Ambulatory Visit: Payer: Self-pay | Admitting: *Deleted

## 2015-01-03 MED ORDER — NITROGLYCERIN 0.2 MG/HR TD PT24: MEDICATED_PATCH | TRANSDERMAL | Status: DC

## 2015-02-08 ENCOUNTER — Encounter: Payer: Self-pay | Admitting: Sports Medicine

## 2015-02-08 ENCOUNTER — Ambulatory Visit (INDEPENDENT_AMBULATORY_CARE_PROVIDER_SITE_OTHER): Payer: BLUE CROSS/BLUE SHIELD | Admitting: Sports Medicine

## 2015-02-08 VITALS — BP 119/76 | HR 75 | Ht 65.0 in | Wt 160.0 lb

## 2015-02-08 DIAGNOSIS — M79606 Pain in leg, unspecified: Secondary | ICD-10-CM | POA: Diagnosis not present

## 2015-02-08 DIAGNOSIS — M25572 Pain in left ankle and joints of left foot: Secondary | ICD-10-CM | POA: Diagnosis not present

## 2015-02-08 DIAGNOSIS — M7989 Other specified soft tissue disorders: Principal | ICD-10-CM | POA: Insufficient documentation

## 2015-02-08 MED ORDER — NITROGLYCERIN 0.2 MG/HR TD PT24: MEDICATED_PATCH | TRANSDERMAL | Status: DC

## 2015-02-08 NOTE — Assessment & Plan Note (Signed)
I think this is vascular I encouraged trying some compression socks more regularly Keep up some lower extremity exercises Nitroglycerin use seems to help but I don't have a clear explanation  Gradually try to increase her exercise unless the pain recurs

## 2015-02-08 NOTE — Progress Notes (Signed)
Patient ID: Tammy [name], female   DOB: Apr 13, 1980, 33 y.o.   MRN: [id number]  Patient returns with about a 75-80% improvement of her right lower extremity pain and swelling This has been recurring since 2011 Our best evaluation suggests that this is probably vascular She gets more swelling with prolonged standing She gets swelling when on airplanes  She does get improvement while walking by using compression sleeve for her ankles  When this becomes irritated nitroglycerin seems to reduce the pain She is on her fifth month of nitroglycerin and states that this has been a bit slower than when I treated her last year  Past history of hypothyroidism  Review of systems No numbness in the feet No tingling Keratosis Pilarus but no other skin changes No weakness in calf or foot muscles No abnormal cramping  Physical exam No acute distress BP 119/76 mmHg  Pulse 75  Ht 5\' 5"  (1.651 m)  Wt 160 lb (72.576 kg)  BMI 26.63 kg/m2  Skin shows a fine macular papules scattered throughout upper and lower extremities  Bilat Ankle: No visible erythema or swelling. Range of motion is full in all directions. Strength is 5/5 in all directions. Stable lateral and medial ligaments; squeeze test and kleiger test unremarkable; Talar dome nontender; No pain at base of 5th MT; No tenderness over cuboid; No tenderness over N spot or navicular prominence No tenderness on posterior aspects of lateral and medial malleolus No sign of peroneal tendon subluxations; Negative tarsal tunnel tinel's Able to walk 4 steps.  Tendon testing today was normal  There is mild nonspecific edema of both lower extremities in a stocking distribution This is noted with some increase in markings from wearing her socks This is less than on past exams  Arterial pulses to the feet are slightly decreased but present No significant varicosities noted

## 2015-02-08 NOTE — Assessment & Plan Note (Signed)
Today her ankle pain is very mild to deep pressure This is proximal to the anterior tibialis tendon No apparent tendon swelling

## 2015-02-14 ENCOUNTER — Ambulatory Visit: Payer: BLUE CROSS/BLUE SHIELD | Admitting: Sports Medicine

## 2016-01-01 ENCOUNTER — Ambulatory Visit: Payer: Self-pay | Admitting: Podiatry

## 2016-01-05 ENCOUNTER — Ambulatory Visit (INDEPENDENT_AMBULATORY_CARE_PROVIDER_SITE_OTHER): Payer: BLUE CROSS/BLUE SHIELD

## 2016-01-05 ENCOUNTER — Ambulatory Visit (INDEPENDENT_AMBULATORY_CARE_PROVIDER_SITE_OTHER): Payer: BLUE CROSS/BLUE SHIELD | Admitting: Podiatry

## 2016-01-05 ENCOUNTER — Encounter: Payer: Self-pay | Admitting: Podiatry

## 2016-01-05 VITALS — BP 125/82 | HR 84 | Resp 16 | Ht 65.0 in | Wt 164.0 lb

## 2016-01-05 DIAGNOSIS — M76821 Posterior tibial tendinitis, right leg: Secondary | ICD-10-CM

## 2016-01-05 DIAGNOSIS — M778 Other enthesopathies, not elsewhere classified: Secondary | ICD-10-CM

## 2016-01-05 DIAGNOSIS — M79671 Pain in right foot: Secondary | ICD-10-CM

## 2016-01-05 DIAGNOSIS — M779 Enthesopathy, unspecified: Secondary | ICD-10-CM | POA: Diagnosis not present

## 2016-01-05 MED ORDER — IBUPROFEN-FAMOTIDINE 800-26.6 MG PO TABS: 1.0000 | ORAL_TABLET | Freq: Three times a day (TID) | ORAL | 1 refills | Status: DC

## 2016-01-05 NOTE — Progress Notes (Signed)
Subjective:  Patient presents today with pain and tenderness to the right foot. Patient states by new shoes at the same time she began to notice pain and tenderness to the right foot. This is approximately 3 weeks ago. Patient presents today for further treatment and evaluation    Objective/Physical Exam General: The patient is alert and oriented x3 in no acute distress.  Dermatology: Skin is warm, dry and supple bilateral lower extremities. Negative for open lesions or macerations.  Vascular: Palpable pedal pulses bilaterally. No edema or erythema noted. Capillary refill within normal limits.  Neurological: Epicritic and protective threshold grossly intact bilaterally.   Musculoskeletal Exam: Pain on palpation noted to the insertion site of the peroneal brevis tendon at the base of the fifth metatarsal tubercle. Also pain and tenderness noted along the posterior tibial tendon right posterior and distal to the medial malleolus. Range of motion within normal limits to all pedal and ankle joints bilateral. Muscle strength 5/5 in all groups bilateral.   Radiographic Exam:  Normal osseous mineralization. Joint spaces preserved. No fracture/dislocation/boney destruction.    Assessment: #1 posterior tibial tendinitis right #2 peroneal enthesopathy right #3 pain in right foot   Plan of Care:  #1 Patient was evaluated. #2 today prescription for Duexis was dispensed. #3 patient was also scan custom molded orthotics #4 patient is to return to clinic in 4 weeks for orthotic pickup   Dr. Edrick Kins, [place]

## 2016-01-11 ENCOUNTER — Telehealth: Payer: Self-pay | Admitting: *Deleted

## 2016-01-11 NOTE — Telephone Encounter (Signed)
Pt asked if the rx was to be taken as directed or as needed. I left message informing pt that she should take the medication as directed for 3 weeks then take as needed.

## 2016-01-12 ENCOUNTER — Encounter: Payer: Self-pay | Admitting: *Deleted

## 2016-02-06 ENCOUNTER — Ambulatory Visit (INDEPENDENT_AMBULATORY_CARE_PROVIDER_SITE_OTHER): Payer: BLUE CROSS/BLUE SHIELD | Admitting: Podiatry

## 2016-02-06 DIAGNOSIS — M79671 Pain in right foot: Secondary | ICD-10-CM

## 2016-02-06 NOTE — Progress Notes (Signed)
Patient ID: Tammy [name], female   DOB: 1980/12/20, 35 y.o.   MRN: [id number]  Patient presents for orthotic pick up.  Verbal and written break in and wear instructions given.  Patient will follow up in 4 weeks if symptoms worsen or fail to improve.

## 2016-02-06 NOTE — Patient Instructions (Signed)

## 2016-03-15 ENCOUNTER — Ambulatory Visit: Payer: BLUE CROSS/BLUE SHIELD | Admitting: Podiatry

## 2016-03-22 ENCOUNTER — Ambulatory Visit (INDEPENDENT_AMBULATORY_CARE_PROVIDER_SITE_OTHER): Payer: BLUE CROSS/BLUE SHIELD | Admitting: Podiatry

## 2016-03-22 DIAGNOSIS — B351 Tinea unguium: Secondary | ICD-10-CM | POA: Diagnosis not present

## 2016-03-22 DIAGNOSIS — M779 Enthesopathy, unspecified: Secondary | ICD-10-CM | POA: Diagnosis not present

## 2016-03-22 DIAGNOSIS — M76821 Posterior tibial tendinitis, right leg: Secondary | ICD-10-CM

## 2016-03-22 DIAGNOSIS — M778 Other enthesopathies, not elsewhere classified: Secondary | ICD-10-CM

## 2016-03-22 MED ORDER — NONFORMULARY OR COMPOUNDED ITEM: 1.0000 [drp] | Freq: Every day | 2 refills | Status: DC

## 2016-03-24 NOTE — Progress Notes (Signed)
Subjective:  Patient presents today for one-month follow-up for her custom molded orthotics. Patient also presents for posterior tibial tendinitis follow-up right lower extremity. Patient states that she has no more pain to her posterior tibial tendon. Pain has resolved. Patient is frustrated today because of her orthotics. She states that they do not fit in her shoes and they're too bulky in the heel.    Objective/Physical Exam General: The patient is alert and oriented x3 in no acute distress.  Dermatology: Discolored, onychodystrophy of nails noted to the bilateral great toes. Skin is warm, dry and supple bilateral lower extremities. Negative for open lesions or macerations.  Vascular: Palpable pedal pulses bilaterally. No edema or erythema noted. Capillary refill within normal limits.  Neurological: Epicritic and protective threshold grossly intact bilaterally.   Musculoskeletal Exam: Range of motion within normal limits to all pedal and ankle joints bilateral. Muscle strength 5/5 in all groups bilateral.   Assessment: #1 posterior tibial tendinitis right-resolved #2 peroneal enthesopathy right-resolved #3 onychomycosis bilateral great toenails #4 irritation related to custom molded orthotics  Plan of Care:  #1 Patient was evaluated. #2 today we discussed the necessity of a deep heel cup orthotics to control the rear foot. Patient understands orthotics function. #3 recommend different pair of shoes to accommodate her heel cup #4 recommend antifungal nail lacquer #5 return to clinic when necessary   Edrick Kins, DPM [facility]  Dr. Edrick Kins, [address]                                        [place], [place] [address]                Office [phone]  Fax [phone]

## 2016-06-11 ENCOUNTER — Other Ambulatory Visit: Payer: Self-pay

## 2016-06-11 DIAGNOSIS — E039 Hypothyroidism, unspecified: Secondary | ICD-10-CM

## 2016-06-12 ENCOUNTER — Other Ambulatory Visit: Payer: Self-pay

## 2016-06-12 DIAGNOSIS — E039 Hypothyroidism, unspecified: Secondary | ICD-10-CM

## 2016-06-14 LAB — T3, FREE: T3, Free: 3.3 pg/mL (ref 2.0–4.4)

## 2016-06-14 LAB — TSH+FREE T4
FREE T4: 0.91 ng/dL (ref 0.82–1.77)
TSH: 0.035 u[IU]/mL — AB (ref 0.450–4.500)

## 2016-07-23 ENCOUNTER — Ambulatory Visit: Payer: BLUE CROSS/BLUE SHIELD

## 2016-09-03 ENCOUNTER — Other Ambulatory Visit: Payer: BLUE CROSS/BLUE SHIELD

## 2016-09-05 ENCOUNTER — Other Ambulatory Visit: Payer: BLUE CROSS/BLUE SHIELD

## 2016-09-05 DIAGNOSIS — E039 Hypothyroidism, unspecified: Secondary | ICD-10-CM

## 2016-09-11 LAB — T3, FREE: T3, Free: 3.9 pg/mL (ref 2.0–4.4)

## 2016-09-11 LAB — IRON AND TIBC
IRON SATURATION: 20 % (ref 15–55)
IRON: 65 ug/dL (ref 27–159)
TIBC: 330 ug/dL (ref 250–450)
UIBC: 265 ug/dL (ref 131–425)

## 2016-09-11 LAB — THYROID PEROXIDASE ANTIBODY: THYROID PEROXIDASE ANTIBODY: 7 [IU]/mL (ref 0–34)

## 2016-09-11 LAB — FERRITIN: Ferritin: 72 ng/mL (ref 15–150)

## 2016-09-11 LAB — TSH+FREE T4
FREE T4: 0.76 ng/dL — AB (ref 0.82–1.77)
TSH: 1.19 u[IU]/mL (ref 0.450–4.500)

## 2016-09-11 LAB — T3, REVERSE: REVERSE T3, SERUM: 9.7 ng/dL (ref 9.2–24.1)

## 2017-02-24 ENCOUNTER — Ambulatory Visit: Payer: BLUE CROSS/BLUE SHIELD | Admitting: Family Medicine

## 2017-03-16 NOTE — Progress Notes (Signed)
[name] [address] [place], [place] [address] Phone: [phone] Subjective:    I'm seeing this patient by the request  of:  System, Pcp Not In   CC: Shoulder pain  [id number]  Tammy [name] is a 36 y.o. female coming in with complaint of bilateral shoulder pain. Left is worse than right.   Onset- Chronic (End of October) Location- anterior joint line Duration- Gets worse as day goes on Character- Achy Aggravating factors- Lifting Reliving factors-  Therapies tried- Ice, cream, massage therapist Severity-4/10 and improving     Past Medical History:  Diagnosis Date  . Hypothyroidism    On Armour Thyroid   Past Surgical History:  Procedure Laterality Date  . HIP SURGERY Left 11/1990   2 screws inserted  . TONSILLECTOMY  10/89   Social History   Socioeconomic History  . Marital status: Single    Spouse name: None  . Number of children: None  . Years of education: None  . Highest education level: None  Social Needs  . Financial resource strain: None  . Food insecurity - worry: None  . Food insecurity - inability: None  . Transportation needs - medical: None  . Transportation needs - non-medical: None  Occupational History  . Occupation: Magazine features editor: [employer]  Tobacco Use  . Smoking status: Never Smoker  . Smokeless tobacco: Never Used  Substance and Sexual Activity  . Alcohol use: Yes    Comment: social  . Drug use: No  . Sexual activity: None  Other Topics Concern  . None  Social History Narrative  . None   Allergies  Allergen Reactions  . Morphine Nausea And Vomiting  . Penicillins Rash   Family History  Problem Relation Age of Onset  . Diabetes Maternal Uncle   . Hypertension Father   . Gout Father      Past medical history, social, surgical and family history all reviewed in electronic medical record.  No pertanent information unless stated regarding to the chief  complaint.   Review of Systems:Review of systems updated and as accurate as of 03/17/17  No headache, visual changes, nausea, vomiting, diarrhea, constipation, dizziness, abdominal pain, skin rash, fevers, chills, night sweats, weight loss, swollen lymph nodes, body aches, joint swelling, muscle aches, chest pain, shortness of breath, mood changes.   Objective  Blood pressure 124/82, pulse 75, height 5\' 5"  (1.651 m), weight 167 lb (75.8 kg), SpO2 99 %. Systems examined below as of 03/17/17   General: No apparent distress alert and oriented x3 mood and affect normal, dressed appropriately.  HEENT: Pupils equal, extraocular movements intact  Respiratory: Patient's speak in full sentences and does not appear short of breath  Cardiovascular: No lower extremity edema, non tender, no erythema  Skin: Warm dry intact with no signs of infection or rash on extremities or on axial skeleton.  Abdomen: Soft nontender  Neuro: Cranial nerves II through XII are intact, neurovascularly intact in all extremities with 2+ DTRs and 2+ pulses.  Lymph: No lymphadenopathy of posterior or anterior cervical chain or axillae bilaterally.  Gait normal with good balance and coordination.  MSK:  Non tender with full range of motion and good stability and symmetric strength and tone of  elbows, wrist, hip, knee and ankles bilaterally.  Shoulder: left Inspection reveals no abnormalities, atrophy or asymmetry. Palpation is normal with no tenderness over AC joint or bicipital groove. ROM is full in all planes passively. Rotator  cuff strength normal throughout. signs of impingement with positive Neer and Hawkin's tests, but negative empty can sign. Speeds and Yergason's tests normal. No labral pathology noted with negative Obrien's, negative clunk and good stability. Normal scapular function observed. No painful arc and no drop arm sign. No apprehension sign  MSK US performed of: left This study was ordered, performed,  and interpreted by Charlann Boxer D.O.  Shoulder:   Supraspinatus:  Appears normal on long and transverse views, Bursal bulge seen with shoulder abduction on impingement view. Infraspinatus:  Appears normal on long and transverse views. Significant increase in Doppler flow Subscapularis:  Appears normal on long and transverse views. Positive bursa Teres Minor:  Appears normal on long and transverse views. AC joint:  Capsule undistended, no geyser sign. Glenohumeral Joint:  Appears normal without effusion. Glenoid Labrum:  Intact without visualized tears. Biceps Tendon:  Appears normal on long and transverse views, no fraying of tendon, tendon located in intertubercular groove, no subluxation with shoulder internal or external rotation.  Impression: Subacromial bursitis  97110; 15 additional minutes spent for Therapeutic exercises as stated in above notes.  This included exercises focusing on stretching, strengthening, with significant focus on eccentric aspects.   Long term goals include an improvement in range of motion, strength, endurance as well as avoiding reinjury. Patient's frequency would include in 1-2 times a day, 3-5 times a week for a duration of 6-12 weeks.Shoulder Exercises that included:  Basic scapular stabilization to include adduction and depression of scapula Scaption, focusing on proper movement and good control Internal and External rotation utilizing a theraband, with elbow tucked at side entire time Rows with theraband which was given    Proper technique shown and discussed handout in great detail with ATC.  All questions were discussed and answered.      Impression and Recommendations:     This case required medical decision making of moderate complexity.      Note: This dictation was prepared with Dragon dictation along with smaller phrase technology. Any transcriptional errors that result from this process are unintentional.

## 2017-03-17 ENCOUNTER — Ambulatory Visit: Payer: BLUE CROSS/BLUE SHIELD | Admitting: Family Medicine

## 2017-03-17 ENCOUNTER — Encounter: Payer: Self-pay | Admitting: Family Medicine

## 2017-03-17 ENCOUNTER — Ambulatory Visit: Payer: Self-pay

## 2017-03-17 VITALS — BP 124/82 | HR 75 | Ht 65.0 in | Wt 167.0 lb

## 2017-03-17 DIAGNOSIS — M25512 Pain in left shoulder: Secondary | ICD-10-CM

## 2017-03-17 DIAGNOSIS — G8929 Other chronic pain: Secondary | ICD-10-CM

## 2017-03-17 DIAGNOSIS — M7552 Bursitis of left shoulder: Secondary | ICD-10-CM | POA: Insufficient documentation

## 2017-03-17 MED ORDER — DICLOFENAC SODIUM 2 % TD SOLN: 2.0000 g | Freq: Two times a day (BID) | TRANSDERMAL | 3 refills | Status: DC

## 2017-03-17 MED ORDER — VITAMIN D (ERGOCALCIFEROL) 1.25 MG (50000 UNIT) PO CAPS: 50000.0000 [IU] | ORAL_CAPSULE | ORAL | 0 refills | Status: DC

## 2017-03-17 NOTE — Assessment & Plan Note (Signed)
Mild shoulder bursitis that seems to be improving.  Discussed with patient again in great length, we discussed icing regimen, home exercises and patient work with Product/process development scientist.  We discussed which activities to avoid.  Once weekly vitamin D for strength and endurance.  Topical anti-inflammatories prescribed.  Follow-up with me again in 4 weeks

## 2017-03-17 NOTE — Patient Instructions (Signed)
Good to see you.  Ice 20 minutes 2 times daily. Usually after activity and before bed. Exercises 3 times a week.  pennsaid pinkie amount topically 2 times daily as needed.  Once weekly vitamin D for 12 weeks then change to daily 5000 IU daily thereafter  Turmeric 500mg  twice daily  Keep hands within peripheral vision.  Get back in the gym  See you again in 4 weeks Happy New Year!

## 2017-03-18 ENCOUNTER — Encounter: Payer: Self-pay | Admitting: Family Medicine

## 2017-04-16 NOTE — Progress Notes (Signed)
[name] [address] [place], [place] [address] Phone: [phone] Subjective:      CC: Left shoulder pain follow-up  [id number]  [name] is a 37 y.o. female coming in with complaint of left shoulder pain. She has pain closer to the end of the day. Pain still persists in the anterior portion of shoulder. She has been doing her HEP which she feels has helped.       Past Medical History:  Diagnosis Date  . Hypothyroidism    On Armour Thyroid   Past Surgical History:  Procedure Laterality Date  . HIP SURGERY Left 11/1990   2 screws inserted  . TONSILLECTOMY  10/89   Social History   Socioeconomic History  . Marital status: Single    Spouse name: Not on file  . Number of children: Not on file  . Years of education: Not on file  . Highest education level: Not on file  Social Needs  . Financial resource strain: Not on file  . Food insecurity - worry: Not on file  . Food insecurity - inability: Not on file  . Transportation needs - medical: Not on file  . Transportation needs - non-medical: Not on file  Occupational History  . Occupation: Magazine features editor: [employer]  Tobacco Use  . Smoking status: Never Smoker  . Smokeless tobacco: Never Used  Substance and Sexual Activity  . Alcohol use: Yes    Comment: social  . Drug use: No  . Sexual activity: Not on file  Other Topics Concern  . Not on file  Social History Narrative  . Not on file   Allergies  Allergen Reactions  . Morphine Nausea And Vomiting  . Penicillins Rash   Family History  Problem Relation Age of Onset  . Diabetes Maternal Uncle   . Hypertension Father   . Gout Father      Past medical history, social, surgical and family history all reviewed in electronic medical record.  No pertanent information unless stated regarding to the chief complaint.   Review of Systems:Review of systems updated and as accurate as of 04/16/17  No headache, visual changes, nausea, vomiting, diarrhea, constipation, dizziness, abdominal pain, skin rash, fevers, chills, night sweats, weight loss, swollen lymph nodes, body aches, joint swelling, muscle aches, chest pain, shortness of breath, mood changes.  Mild positive muscle aches  Objective  There were no vitals taken for this visit. Systems examined below as of 04/16/17   General: No apparent distress alert and oriented x3 mood and affect normal, dressed appropriately.  HEENT: Pupils equal, extraocular movements intact  Respiratory: Patient's speak in full sentences and does not appear short of breath  Cardiovascular: No lower extremity edema, non tender, no erythema  Skin: Warm dry intact with no signs of infection or rash on extremities or on axial skeleton.  Abdomen: Soft nontender  Neuro: Cranial nerves II through XII are intact, neurovascularly intact in all extremities with 2+ DTRs and 2+ pulses.  Lymph: No lymphadenopathy of posterior or anterior cervical chain or axillae bilaterally.  Gait normal with good balance and coordination.  MSK:  Non tender with full range of motion and good stability and symmetric strength and tone of  elbows, wrist, hip, knee and ankles bilaterally.  Shoulder: Left Inspection reveals no abnormalities, atrophy or asymmetry. Palpation is normal with no tenderness over AC joint or bicipital groove.  Pain over the coracoid process process and does have what  appears to be a cystic area. ROM is full in all planes. Rotator cuff strength normal throughout. Positive impingement Speeds and Yergason's tests normal. No labral pathology noted with negative Obrien's, negative clunk and good stability. Normal scapular function observed. No painful arc and no drop arm sign. No apprehension sign Contralateral shoulder unremarkable     Impression and Recommendations:     This case required medical decision making of moderate complexity.      Note:  This dictation was prepared with Dragon dictation along with smaller phrase technology. Any transcriptional errors that result from this process are unintentional.

## 2017-04-17 ENCOUNTER — Encounter: Payer: Self-pay | Admitting: Family Medicine

## 2017-04-17 ENCOUNTER — Ambulatory Visit: Payer: BLUE CROSS/BLUE SHIELD | Admitting: Family Medicine

## 2017-04-17 DIAGNOSIS — M71312 Other bursal cyst, left shoulder: Secondary | ICD-10-CM | POA: Insufficient documentation

## 2017-04-17 NOTE — Assessment & Plan Note (Signed)
Patient does have a cyst over the coracoid process.  Discussed icing regimen and home exercises.  Discussed which activities to doing which wants to avoid.  Patient was given the opportunity for me to do an injection which patient declined.  Patient will come back and see me again in 6 weeks.

## 2017-04-17 NOTE — Patient Instructions (Signed)
Good to see you  Looks like a cyst Get back at it  OK to lift but start 50% of what you think you should and increase 10% a week Ice is your friend.  When needed try the pennsaid 2 times a day for 5 days Finish the vitamin D and then 2000 IU daily  Calcium 600mg -1200mg  daily  See me again in 6 weeks if not resolved.

## 2017-04-20 ENCOUNTER — Encounter: Payer: Self-pay | Admitting: Family Medicine

## 2017-05-28 ENCOUNTER — Ambulatory Visit: Payer: BLUE CROSS/BLUE SHIELD | Admitting: Family Medicine

## 2017-05-28 ENCOUNTER — Ambulatory Visit (INDEPENDENT_AMBULATORY_CARE_PROVIDER_SITE_OTHER)
Admission: RE | Admit: 2017-05-28 | Discharge: 2017-05-28 | Disposition: A | Discharge status: Home / Self Care | Payer: BLUE CROSS/BLUE SHIELD | Source: Ambulatory Visit | Attending: Family Medicine | Admitting: Family Medicine

## 2017-05-28 ENCOUNTER — Encounter: Payer: Self-pay | Admitting: Family Medicine

## 2017-05-28 VITALS — BP 130/92 | Ht 65.0 in | Wt 163.0 lb

## 2017-05-28 DIAGNOSIS — M25552 Pain in left hip: Secondary | ICD-10-CM | POA: Insufficient documentation

## 2017-05-28 MED ORDER — VITAMIN D (ERGOCALCIFEROL) 1.25 MG (50000 UNIT) PO CAPS: 50000.0000 [IU] | ORAL_CAPSULE | ORAL | 0 refills | Status: DC

## 2017-05-28 NOTE — Assessment & Plan Note (Signed)
Left hip pain.  Seems to be fairly severe.  Discussed with patient that likely not the screws were loosening but we will get x-rays.  I do believe that there is some underlying arthritis that is contributing.  Differential includes abductor muscle injury.  Home exercises given.  Discussed icing regimen, compression.  Patient will try to make these different changes and follow-up with me again in 4 weeks.

## 2017-05-28 NOTE — Patient Instructions (Signed)
Good to see you  Tammy [name] is your friend. Ice 20 minutes 2 times daily. Usually after activity and before bed. Exercises 3 times a week.  Thigh compression sleeve daily  No jumping or running Xray downstairs Refilled vitmain D  Tylenol 500mg  3 times a day  See me again in 4 weeks

## 2017-05-28 NOTE — Progress Notes (Signed)
[name] [place], [place] [address] Phone: [phone] Subjective:    I'm seeing this patient by the request  of:    CC: Left hip pain  [id number]  Tammy [name] is a 37 y.o. female coming in with complaint of left hip pain. Patient was walking into a room and turned left and had immediate sharp pain. She had a surgery in which she had screws placed in that joint and she wants to make sure that everything is ok. Patient does not have pain with sitting but the pain increases with standing or performing FABERs. Pain is located over the anterior and medial portion of leg.  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     Past Medical History:  Diagnosis Date  . Hypothyroidism    On Armour Thyroid   Past Surgical History:  Procedure Laterality Date  . HIP SURGERY Left 11/1990   2 screws inserted  . TONSILLECTOMY  10/89   Social History   Socioeconomic History  . Marital status: Single    Spouse name: Not on file  . Number of children: Not on file  . Years of education: Not on file  . Highest education level: Not on file  Social Needs  . Financial resource strain: Not on file  . Food insecurity - worry: Not on file  . Food insecurity - inability: Not on file  . Transportation needs - medical: Not on file  . Transportation needs - non-medical: Not on file  Occupational History  . Occupation: Magazine features editor: [employer]  Tobacco Use  . Smoking status: Never Smoker  . Smokeless tobacco: Never Used  Substance and Sexual Activity  . Alcohol use: Yes    Comment: social  . Drug use: No  . Sexual activity: Not on file  Other Topics Concern  . Not on file  Social History Narrative  . Not on file   Allergies  Allergen Reactions  . Morphine Nausea And Vomiting  . Penicillins Rash   Family History  Problem Relation Age of Onset  .  Diabetes Maternal Uncle   . Hypertension Father   . Gout Father      Past medical history, social, surgical and family history all reviewed in electronic medical record.  No pertanent information unless stated regarding to the chief complaint.   Review of Systems:Review of systems updated and as accurate as of 05/28/17  No headache, visual changes, nausea, vomiting, diarrhea, constipation, dizziness, abdominal pain, skin rash, fevers, chills, night sweats, weight loss, swollen lymph nodes, body aches, joint swelling, muscle aches, chest pain, shortness of breath, mood changes.   Objective  There were no vitals taken for this visit. Systems examined below as of 05/28/17   General: No apparent distress alert and oriented x3 mood and affect normal, dressed appropriately.  HEENT: Pupils equal, extraocular movements intact  Respiratory: Patient's speak in full sentences and does not appear short of breath  Cardiovascular: No lower extremity edema, non tender, no erythema  Skin: Warm dry intact with no signs of infection or rash on extremities or on axial skeleton.  Abdomen: Soft nontender  Neuro: Cranial nerves II through XII are intact, neurovascularly intact in all extremities with 2+ DTRs and 2+ pulses.  Lymph: No lymphadenopathy of posterior or anterior cervical chain or axillae bilaterally.  Gait normal with good balance and coordination.  MSK:  Non tender with full range of motion and good stability and symmetric strength and tone of shoulders, elbows, wrist, , knee and ankles bilaterally.  Hip: Left ROM IR: 15 Deg, ER: 25 Deg, Flexion: 100 Deg, Extension: 100 Deg, Abduction: 45 Deg, Adduction: 15 Deg Strength 4 out of 5 but symmetric to contralateral side Pelvic alignment unremarkable to inspection and palpation. Standing hip rotation and gait without trendelenburg sign / unsteadiness. Pain with Corky Sox and with internal rotation. Mild pain over the left sacroiliac joint        Impression and Recommendations:     This case required medical decision making of moderate complexity.      Note: This dictation was prepared with Dragon dictation along with smaller phrase technology. Any transcriptional errors that result from this process are unintentional.

## 2017-05-29 ENCOUNTER — Ambulatory Visit: Payer: BLUE CROSS/BLUE SHIELD | Admitting: Family Medicine

## 2017-05-31 ENCOUNTER — Encounter: Payer: Self-pay | Admitting: Family Medicine

## 2017-06-04 ENCOUNTER — Ambulatory Visit: Payer: BLUE CROSS/BLUE SHIELD | Admitting: Family Medicine

## 2017-06-25 NOTE — Progress Notes (Signed)
[name] [address] [place], [place] [address] Phone: [phone] Subjective:    CC:   Left shoulder and hip pain follow-up  [id number]  Tammy [name] is a 37 y.o. female coming in with complaint of left shoulder pain.  Found to have a bursitis.  Feeling completely better at this time but unfortunately she is concerned about increasing activity.  Anytime patient increase his activity she feels like she has more discomfort and pain.  Patient was also having left hip pain.  History of skin if he has an IM rod.  X-rays were taken last exam and independently visualized by me.  Did not show any loosening.  Feels that the snapping the patient was having is significantly better at this time.       Past Medical History:  Diagnosis Date  . Hypothyroidism    On Armour Thyroid   Past Surgical History:  Procedure Laterality Date  . HIP SURGERY Left 11/1990   2 screws inserted  . TONSILLECTOMY  10/89   Social History   Socioeconomic History  . Marital status: Single    Spouse name: Not on file  . Number of children: Not on file  . Years of education: Not on file  . Highest education level: Not on file  Occupational History  . Occupation: Magazine features editor: [employer]  . Financial resource strain: Not on file  . Food insecurity:    Worry: Not on file    Inability: Not on file  . Transportation needs:    Medical: Not on file    Non-medical: Not on file  Tobacco Use  . Smoking status: Never Smoker  . Smokeless tobacco: Never Used  Substance and Sexual Activity  . Alcohol use: Yes    Comment: social  . Drug use: No  . Sexual activity: Not on file  Lifestyle  . Physical activity:    Days per week: Not on file    Minutes per session: Not on file  . Stress: Not on file  Relationships  . Social connections:    Talks on phone: Not on file    Gets together: Not on file    Attends religious service: Not  on file    Active member of club or organization: Not on file    Attends meetings of clubs or organizations: Not on file    Relationship status: Not on file  Other Topics Concern  . Not on file  Social History Narrative  . Not on file   Allergies  Allergen Reactions  . Morphine Nausea And Vomiting  . Penicillins Rash   Family History  Problem Relation Age of Onset  . Diabetes Maternal Uncle   . Hypertension Father   . Gout Father      Past medical history, social, surgical and family history all reviewed in electronic medical record.  No pertanent information unless stated regarding to the chief complaint.   Review of Systems:Review of systems updated and as accurate as of 06/26/17  No headache, visual changes, nausea, vomiting, diarrhea, constipation, dizziness, abdominal pain, skin rash, fevers, chills, night sweats, weight loss, swollen lymph nodes, body aches, joint swelling, muscle aches, chest pain, shortness of breath, mood changes.   Objective  Blood pressure 124/80, pulse 67, height 5\' 5"  (1.651 m), weight 163 lb (73.9 kg), SpO2 98 %. Systems examined below as of 06/26/17   General: No apparent distress alert and oriented x3 mood  and affect normal, dressed appropriately.  HEENT: Pupils equal, extraocular movements intact  Respiratory: Patient's speak in full sentences and does not appear short of breath  Cardiovascular: No lower extremity edema, non tender, no erythema  Skin: Warm dry intact with no signs of infection or rash on extremities or on axial skeleton.  Abdomen: Soft nontender  Neuro: Cranial nerves II through XII are intact, neurovascularly intact in all extremities with 2+ DTRs and 2+ pulses.  Lymph: No lymphadenopathy of posterior or anterior cervical chain or axillae bilaterally.  Gait normal with good balance and coordination.  MSK: Mild tender with full range of motion and good stability and symmetric strength and tone of shoulders, elbows, wrist, ,  knee and ankles bilaterally.  Left hip exam still shows some mild discomfort to palpation over the lateral aspect.  Patient has no decrease in range of motion.  Negative straight leg test.  Left shoulder exam shows full range of motion with very mild impingement still noted.    Impression and Recommendations:     This case required medical decision making of moderate complexity.      Note: This dictation was prepared with Dragon dictation along with smaller phrase technology. Any transcriptional errors that result from this process are unintentional.

## 2017-06-26 ENCOUNTER — Encounter: Payer: Self-pay | Admitting: Family Medicine

## 2017-06-26 ENCOUNTER — Ambulatory Visit: Payer: BLUE CROSS/BLUE SHIELD | Admitting: Family Medicine

## 2017-06-26 VITALS — BP 124/80 | HR 67 | Ht 65.0 in | Wt 163.0 lb

## 2017-06-26 DIAGNOSIS — M2550 Pain in unspecified joint: Secondary | ICD-10-CM | POA: Diagnosis not present

## 2017-06-26 DIAGNOSIS — M7552 Bursitis of left shoulder: Secondary | ICD-10-CM | POA: Diagnosis not present

## 2017-06-26 DIAGNOSIS — M25559 Pain in unspecified hip: Secondary | ICD-10-CM

## 2017-06-26 DIAGNOSIS — M25552 Pain in left hip: Secondary | ICD-10-CM | POA: Diagnosis not present

## 2017-06-26 NOTE — Assessment & Plan Note (Signed)
More secondary to a greater trochanteric bursitis and as well as a snapping hip syndrome.  No lucency moving.  Discussed icing regimen and home exercise.  Follow-up again in 4-6 weeks.  Will be referred to formal physical therapy.

## 2017-06-26 NOTE — Patient Instructions (Signed)
Good to see you  I am glad you are doing better Ice is your friend.  We will get you in with PT  I will also give you new ones for your knee.  Continue the vitaminD Try more biking then walking See me again in 6-8 weeks

## 2017-06-26 NOTE — Assessment & Plan Note (Signed)
Discussed with patient in great length.  Patient encouraged to go to formal physical therapy.

## 2017-06-26 NOTE — Assessment & Plan Note (Signed)
Polyarthralgia.  Patient has had this for some time.  Has seen multiple providers for something like this previously.  We discussed some other labs to rule out such things as dermatomyositis could be potentially contributing.  Patient is in agreement with the plan.

## 2017-07-08 ENCOUNTER — Encounter: Payer: Self-pay | Admitting: Family Medicine

## 2017-07-14 ENCOUNTER — Telehealth: Payer: Self-pay

## 2017-07-14 NOTE — Telephone Encounter (Signed)
Spoke with patient and am leaving the labs up front for her to pick up.

## 2017-07-17 ENCOUNTER — Encounter: Payer: Self-pay | Admitting: Family Medicine

## 2017-08-04 ENCOUNTER — Encounter: Payer: Self-pay | Admitting: Family Medicine

## 2017-08-13 ENCOUNTER — Ambulatory Visit: Payer: BLUE CROSS/BLUE SHIELD | Admitting: Family Medicine

## 2017-08-15 ENCOUNTER — Other Ambulatory Visit: Payer: Self-pay | Admitting: Family Medicine

## 2017-08-17 ENCOUNTER — Encounter: Payer: Self-pay | Admitting: Family Medicine

## 2017-11-12 ENCOUNTER — Other Ambulatory Visit: Payer: Self-pay | Admitting: Family Medicine

## 2017-12-04 ENCOUNTER — Ambulatory Visit: Payer: BLUE CROSS/BLUE SHIELD

## 2018-11-06 IMAGING — DX DG HIP (WITH OR WITHOUT PELVIS) 2-3V*L*
3 series · 3 of 3 positions shown · non-contrast
Comparison: None.

CLINICAL DATA: Left hip pain

EXAM:
DG HIP (WITH OR WITHOUT PELVIS) 2-3V LEFT

[pelvis ap]
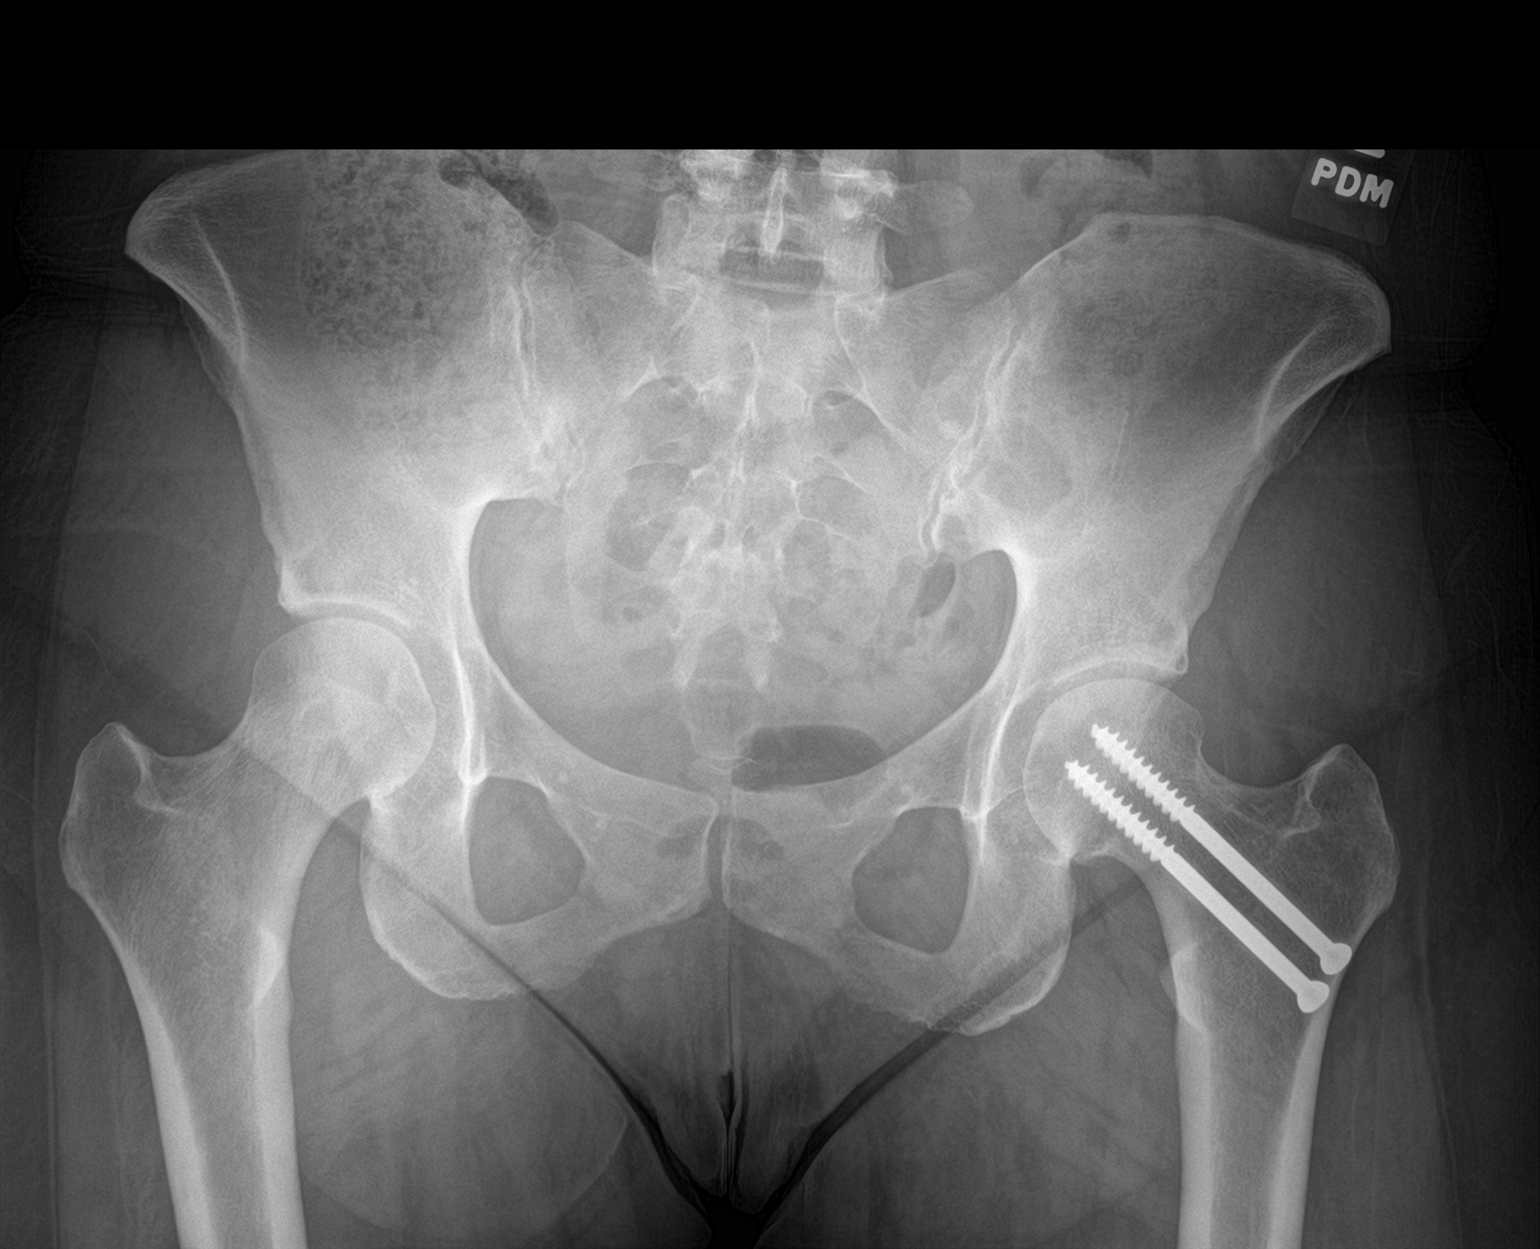

[hip ap]
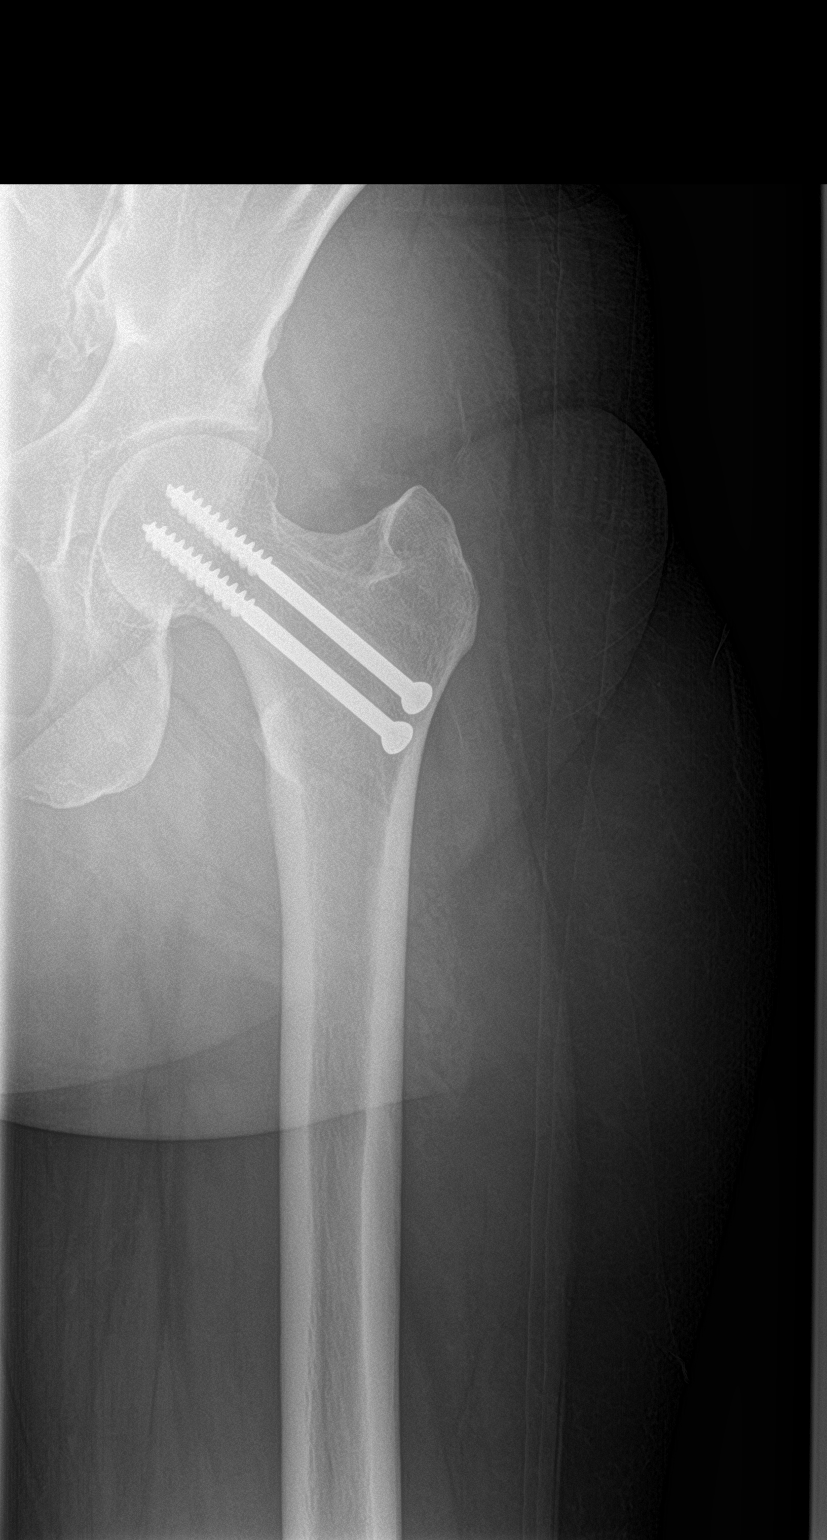

[hip lat]
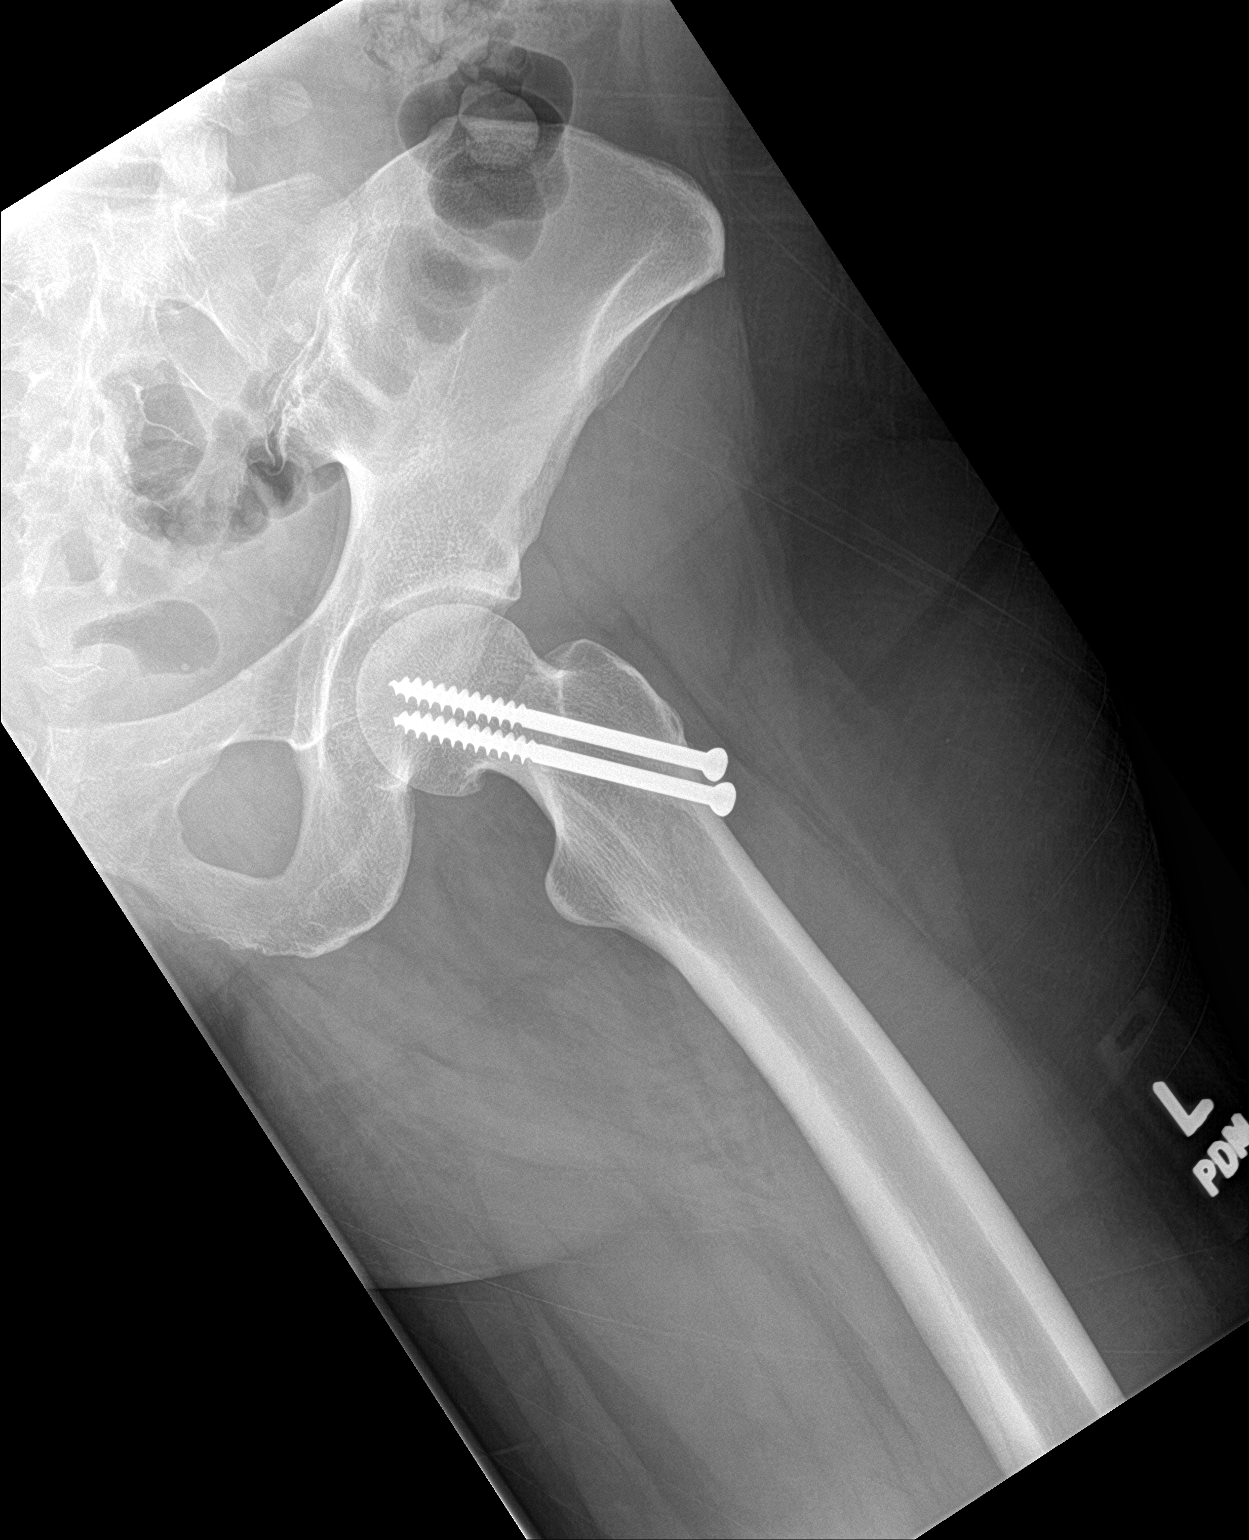

[3 of 3 positions shown; findings below may reference images not displayed]

FINDINGS: Screws are noted within the left femoral neck. No acute bony
abnormality. Specifically, no fracture, subluxation, or dislocation.
Hip joints and SI joints are symmetric and unremarkable.
IMPRESSION: No acute bony abnormality.

## 2018-11-25 ENCOUNTER — Ambulatory Visit: Payer: BC Managed Care – PPO

## 2018-11-25 ENCOUNTER — Other Ambulatory Visit: Payer: Self-pay

## 2018-11-25 DIAGNOSIS — Z23 Encounter for immunization: Secondary | ICD-10-CM

## 2019-05-28 ENCOUNTER — Ambulatory Visit: Payer: BC Managed Care – PPO | Attending: Internal Medicine

## 2019-05-28 DIAGNOSIS — Z23 Encounter for immunization: Secondary | ICD-10-CM

## 2019-05-28 NOTE — Progress Notes (Signed)
   Covid-19 Vaccination Clinic  Name:  Tammy [name]    MRN: [id number] DOB: December 25, 1980  05/28/2019  [name] was observed post Covid-19 immunization for 54 minutes. She was provided with Vaccine Information Sheet and instruction to access the V-Safe system.   [name] was instructed to call 911 with any severe reactions post vaccine: Marland Kitchen Difficulty breathing  . Swelling of face and throat  . A fast heartbeat  . A bad rash all over body  . Dizziness and weakness   Immunizations Administered    Name Date Dose VIS Date Route   Pfizer COVID-19 Vaccine 05/28/2019  3:54 PM 0.3 mL 02/26/2019 Intramuscular   Manufacturer: [place]   Lot: [id number]   [place]: [id number]     AT [id number] Pt noted to have redness of cheeks during discharge process. Pt seated to evaluate further. Pt states she felt ok and did not realize it. BP 148/97. P86 p ox 100%. P sat until 1638. Pt felt well. EMS spoke to pt and gave pt discharge instructions and what to look for once arrival to home. Pt had rash on both arms and states this is normal all the time. Pt had redness on both hands and states she has been seeing a dermatologist for dermatitis. Given Benadryl po 25 mg as a take home dose as needed prn for facial redness.

## 2019-06-21 ENCOUNTER — Ambulatory Visit: Payer: BC Managed Care – PPO | Attending: Internal Medicine

## 2019-06-21 DIAGNOSIS — Z23 Encounter for immunization: Secondary | ICD-10-CM

## 2019-06-21 NOTE — Progress Notes (Signed)
   Covid-19 Vaccination Clinic  Name:  Tammy [name]    MRN: [id number] DOB: 07/17/1980  06/21/2019  [name] was observed post Covid-19 immunization for 15 minutes without incident. She was provided with Vaccine Information Sheet and instruction to access the V-Safe system.   [name] was instructed to call 911 with any severe reactions post vaccine: Marland Kitchen Difficulty breathing  . Swelling of face and throat  . A fast heartbeat  . A bad rash all over body  . Dizziness and weakness   Immunizations Administered    Name Date Dose VIS Date Route   Pfizer COVID-19 Vaccine 06/21/2019  5:03 PM 0.3 mL 02/26/2019 Intramuscular   Manufacturer: [place]   Lot: [id number]   [place]: [id number]

## 2019-07-01 DIAGNOSIS — N915 Oligomenorrhea, unspecified: Secondary | ICD-10-CM | POA: Insufficient documentation

## 2019-07-01 DIAGNOSIS — F419 Anxiety disorder, unspecified: Secondary | ICD-10-CM | POA: Insufficient documentation

## 2019-07-01 DIAGNOSIS — F429 Obsessive-compulsive disorder, unspecified: Secondary | ICD-10-CM | POA: Insufficient documentation

## 2019-07-01 DIAGNOSIS — Z77120 Contact with and (suspected) exposure to mold (toxic): Secondary | ICD-10-CM | POA: Insufficient documentation

## 2019-07-01 DIAGNOSIS — K589 Irritable bowel syndrome without diarrhea: Secondary | ICD-10-CM | POA: Insufficient documentation

## 2020-01-01 ENCOUNTER — Ambulatory Visit: Payer: BC Managed Care – PPO | Attending: Internal Medicine

## 2020-01-01 DIAGNOSIS — Z23 Encounter for immunization: Secondary | ICD-10-CM

## 2020-01-01 NOTE — Progress Notes (Signed)
   Covid-19 Vaccination Clinic  Name:  Tammy [name]    MRN: [id number] DOB: [birth year]  01/01/2020  Tammy [name] was observed post Covid-19 immunization for 15 minutes without incident. She was provided with Vaccine Information Sheet and instruction to access the V-Safe system.   Tammy [name] was instructed to call 911 with any severe reactions post vaccine: Marland Kitchen Difficulty breathing  . Swelling of face and throat  . A fast heartbeat  . A bad rash all over body  . Dizziness and weakness

## 2020-02-15 DIAGNOSIS — R5383 Other fatigue: Secondary | ICD-10-CM | POA: Insufficient documentation

## 2020-02-15 DIAGNOSIS — D75839 Thrombocytosis, unspecified: Secondary | ICD-10-CM | POA: Insufficient documentation

## 2020-02-15 DIAGNOSIS — B379 Candidiasis, unspecified: Secondary | ICD-10-CM | POA: Insufficient documentation

## 2020-02-16 DIAGNOSIS — E039 Hypothyroidism, unspecified: Secondary | ICD-10-CM | POA: Insufficient documentation

## 2020-05-02 DIAGNOSIS — E7210 Disorders of sulfur-bearing amino-acid metabolism, unspecified: Secondary | ICD-10-CM | POA: Insufficient documentation

## 2020-11-22 ENCOUNTER — Ambulatory Visit (INDEPENDENT_AMBULATORY_CARE_PROVIDER_SITE_OTHER): Payer: BC Managed Care – PPO

## 2020-11-22 ENCOUNTER — Ambulatory Visit: Payer: BC Managed Care – PPO | Admitting: Podiatry

## 2020-11-22 ENCOUNTER — Encounter: Payer: Self-pay | Admitting: Podiatry

## 2020-11-22 ENCOUNTER — Other Ambulatory Visit: Payer: Self-pay

## 2020-11-22 VITALS — BP 125/79 | HR 73 | Temp 98.5°F | Resp 16

## 2020-11-22 DIAGNOSIS — M21619 Bunion of unspecified foot: Secondary | ICD-10-CM

## 2020-11-22 DIAGNOSIS — M21611 Bunion of right foot: Secondary | ICD-10-CM | POA: Diagnosis not present

## 2020-11-22 DIAGNOSIS — M778 Other enthesopathies, not elsewhere classified: Secondary | ICD-10-CM | POA: Diagnosis not present

## 2020-11-22 DIAGNOSIS — M21612 Bunion of left foot: Secondary | ICD-10-CM | POA: Diagnosis not present

## 2020-11-22 MED ORDER — METHYLPREDNISOLONE 4 MG PO TBPK: ORAL_TABLET | ORAL | 0 refills | Status: DC

## 2020-11-22 NOTE — Progress Notes (Signed)
Subjective:  Patient ID: Tammy [name], female    DOB: 28-Jul-1980,  MRN: [id number] HPI Chief Complaint  Patient presents with   Foot Pain    "I have some pain on my right foot at the base of my first toe.  The other thing is that my orthotics are old.  Want to see if they need to be replaced.  Also, I have pronounced calluses on the outer part of both of my feet (5th Met.)."    40 y.o. female presents with the above complaint.   ROS: Denies fever chills nausea vomiting muscle aches pains calf pain back pain chest pain shortness of breath.  Past Medical History:  Diagnosis Date   Hypothyroidism    On Armour Thyroid   Past Surgical History:  Procedure Laterality Date   HIP SURGERY Left 11/1990   2 screws inserted   TONSILLECTOMY  10/89    Current Outpatient Medications:    Dapsone 7.5 % GEL, , Disp: , Rfl:    methylPREDNISolone (MEDROL DOSEPAK) 4 MG TBPK tablet, 6 day dose pack - take as directed, Disp: 21 tablet, Rfl: 0   Tretinoin Microsphere (RETIN-A MICRO PUMP) 0.06 % GEL, Retin-A Micro Pump 0.06 % topical gel   1 application every other day by topical route., Disp: , Rfl:    triamcinolone ointment (KENALOG) 0.1 %, Apply 1 application topically daily., Disp: , Rfl:    adapalene (DIFFERIN) 0.1 % cream, , Disp: , Rfl:    Dapsone 7.5 % GEL, Apply topically., Disp: , Rfl:    Diclofenac Sodium (PENNSAID) 2 % SOLN, Place 2 g onto the skin 2 (two) times daily., Disp: 112 g, Rfl: 3   thyroid (ARMOUR THYROID) 90 MG tablet, Take 75 mg by mouth every other day. , Disp: , Rfl:    tretinoin (ALTRALIN) 0.05 % gel, , Disp: , Rfl:    TURMERIC PO, Take 700 mg by mouth 2 (two) times daily., Disp: , Rfl:    Vitamin D, Ergocalciferol, (DRISDOL) 50000 units CAPS capsule, TAKE 1 CAPSULE (50,000 UNITS TOTAL) BY MOUTH EVERY 7 (SEVEN) DAYS., Disp: 12 capsule, Rfl: 0  Allergies  Allergen Reactions   Morphine Nausea And Vomiting   Penicillins Rash   Review of Systems Objective:   Vitals:    11/22/20 0912  BP: 125/79  Pulse: 73  Resp: 16  Temp: 98.5 F (36.9 C)    General: Well developed, nourished, in no acute distress, alert and oriented x3   Dermatological: Skin is warm, dry and supple bilateral. Nails x 10 are well maintained; remaining integument appears unremarkable at this time. There are no open sores, no preulcerative lesions, no rash or signs of infection present.  Vascular: Dorsalis Pedis artery and Posterior Tibial artery pedal pulses are 2/4 bilateral with immedate capillary fill time. Pedal hair growth present. No varicosities and no lower extremity edema present bilateral.   Neruologic: Grossly intact via light touch bilateral. Vibratory intact via tuning fork bilateral. Protective threshold with Semmes Wienstein monofilament intact to all pedal sites bilateral. Patellar and Achilles deep tendon reflexes 2+ bilateral. No Babinski or clonus noted bilateral.   Musculoskeletal: No gross boney pedal deformities bilateral. No pain, crepitus, or limitation noted with foot and ankle range of motion bilateral. Muscular strength 5/5 in all groups tested bilateral.  She has a tender small area on the proximal base of the second toe right foot.  This appears to be associated with the extensor hood apparatus or the tendon itself.  He does  not appear to be mucoid in nature.  It appears firm and painful and does not demonstrated in the dermis but subcutaneous.  It is painful with dorsiflexion and plantarflexion.  Gait: Unassisted, Nonantalgic.    Radiographs:  Radiographs taken today demonstrate no osseous abnormalities.  No soft tissue swelling.  Assessment & Plan:   Assessment: Probable extensor tendinitis possibly secondary to trauma.    Plan: Placed her in stiff soled shoes as it had not been the tendon a lot.  Also start her on Medrol Dosepak.  She will continue anti-inflammatories topically as well as ice.  Follow-up with her in 1 month may need to consider MRI if not  improved     Messi Twedt T. [place], [place]

## 2020-11-23 ENCOUNTER — Telehealth: Payer: Self-pay | Admitting: *Deleted

## 2020-11-23 NOTE — Telephone Encounter (Signed)
"  I saw Dr. Milinda Pointer yesterday.  I was prescribed a steroid.  How much impact will it have on the immune system? How long should I wait before getting my Covid Booster?  Will the steroid interfere with my my Thyroid medication?"

## 2020-12-20 ENCOUNTER — Ambulatory Visit: Payer: BC Managed Care – PPO | Admitting: Podiatry

## 2020-12-20 ENCOUNTER — Other Ambulatory Visit: Payer: Self-pay

## 2020-12-20 ENCOUNTER — Encounter: Payer: Self-pay | Admitting: Podiatry

## 2020-12-20 DIAGNOSIS — D2372 Other benign neoplasm of skin of left lower limb, including hip: Secondary | ICD-10-CM | POA: Diagnosis not present

## 2020-12-20 NOTE — Progress Notes (Signed)
She presents today for follow-up of her extensor tendinitis right foot.  States that is completely well states that she noticed a small knot on her left ankle that has come up.  Objective: Vital signs are stable she is alert oriented x3 she states that it does not hurt like the previous one did though she does have a small intradermal thickening and appears to be more like a bug bite or some type of puncture injury that has left the dermis with a small area of thickening.  It is tender on direct palpation but nontender on range of motion of the ankle joint or the tendons beneath it.  Assessment: Possible dermatofibroma.  Plan: Encouraged to use anti-inflammatory cream on this such as Voltaren gel or cortisone cream.  And I will follow-up with her on an as-needed basis.

## 2021-01-31 NOTE — Progress Notes (Signed)
[facility] [address] [place] Phone: [phone] Subjective:   IVilma Meckel, am serving as a scribe for Dr. Hulan Saas. This visit occurred during the SARS-CoV-2 public health emergency.  Safety protocols were in place, including screening questions prior to the visit, additional usage of staff PPE, and extensive cleaning of exam room while observing appropriate contact time as indicated for disinfecting solutions.   I'm seeing this patient by the request  of:  Nicoletta Dress, MD  CC: right wrist pain   [id number]  06/26/2017 Polyarthralgia.  Patient has had this for some time.  Has seen multiple providers for something like this previously.  We discussed some other labs to rule out such things as dermatomyositis could be potentially contributing.  Patient is in agreement with the plan.  More secondary to a greater trochanteric bursitis and as well as a snapping hip syndrome.  No lucency moving.  Discussed icing regimen and home exercise.  Follow-up again in 4-6 weeks.  Will be referred to formal physical therapy.  Discussed with patient in great length.  Patient encouraged to go to formal physical therapy.  Update 02/01/2021 Cianni Manny is a 40 y.o. female coming in with complaint of R wrist pain. Patient states the pain began last Monday. Pain on the radial side. Sharp pain with certain movements. Ice and topical ointment has helped, but still hurts. Squeezing or holding heavy objects produces pain.       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    Employer: [employer]  Tobacco Use   Smoking status: Never   Smokeless tobacco: Never  Substance and Sexual Activity   Alcohol use: Not Currently    Comment: social   Drug use: No   Sexual activity: Not on file  Other Topics Concern   Not on file  Social History Narrative   Not on file   Social Determinants of Health   Financial Resource Strain: Not on file  Food Insecurity: Not on file  Transportation Needs: Not on file  Physical Activity: Not on file  Stress: Not on file  Social Connections: Not on file   Allergies  Allergen Reactions   Morphine Nausea And Vomiting   Penicillins Rash   Family History  Problem Relation Age of Onset   Diabetes Maternal Uncle    Hypertension Father    Gout Father     Current Outpatient Medications (Endocrine & Metabolic):    liothyronine (CYTOMEL) 5 MCG tablet, Take 5 mcg by mouth daily.      Current Outpatient Medications (Other):    Dapsone 7.5 % GEL,    tretinoin (ALTRALIN) 0.05 % gel,    Tretinoin Microsphere (RETIN-A MICRO PUMP) 0.06 % GEL, Retin-A Micro Pump 0.06 % topical gel   1 application every other day by topical route.   triamcinolone ointment (KENALOG) 0.1 %, Apply 1 application topically daily.   TURMERIC PO, Take 700 mg by mouth 2 (two) times daily.  Review of Systems:  No headache, visual changes, nausea, vomiting, diarrhea, constipation, dizziness, abdominal pain, skin rash, fevers, chills, night sweats, weight loss, swollen lymph nodes, body aches, joint swelling, chest pain, shortness of breath, mood changes. POSITIVE muscle aches  Objective  Blood pressure 126/84, pulse 71, height 5\' 5"  (1.651 m), weight 164 lb (74.4 kg), SpO2 99 %.   General: No apparent distress alert and oriented x3 mood and affect normal, dressed appropriately.  HEENT: Pupils equal, extraocular movements intact  Respiratory: Patient's speak in full sentences and does not  appear short of breath  Cardiovascular: No lower extremity edema, non tender, no erythema  Gait normal with good balance and coordination.  MSK:   Patient's wrist exam shows the patient does have a rash noted across the wrist bilaterally.  Seems to be either secondary to exfoliation or a dermatitis.  No signs of any type of cellulitic changes. Right wrist is more tender to palpation on the palmar aspect proximal to the [facility] joint.  Questionable mass noted in the area.  Good grip strength.  Negative Finkelstein's.  Good capillary refill noted of the fingers.  Neurovascular intact distally.  Limited muscular skeletal ultrasound was performed and interpreted by Hulan Saas, M  Limited musculoskeletal ultrasound shows the patient does have what appears to be a small ganglion cyst of the wrist noted.  No significant hypoechoic changes or increase in Doppler flow in the surrounding area.  No abnormal vascularity.  Patient has no significant hypoechoic changes of any of the tendons in the area either.  No significant swelling of the CMC joint. Impression: Ganglion cyst.    Impression and Recommendations:     The above documentation has been reviewed and is accurate and complete Lyndal Pulley, DO

## 2021-02-01 ENCOUNTER — Encounter: Payer: Self-pay | Admitting: Family Medicine

## 2021-02-01 ENCOUNTER — Ambulatory Visit: Payer: BC Managed Care – PPO | Admitting: Family Medicine

## 2021-02-01 ENCOUNTER — Other Ambulatory Visit: Payer: Self-pay

## 2021-02-01 ENCOUNTER — Ambulatory Visit: Payer: Self-pay

## 2021-02-01 VITALS — BP 126/84 | HR 71 | Ht 65.0 in | Wt 164.0 lb

## 2021-02-01 DIAGNOSIS — M25531 Pain in right wrist: Secondary | ICD-10-CM | POA: Diagnosis not present

## 2021-02-01 DIAGNOSIS — M67431 Ganglion, right wrist: Secondary | ICD-10-CM | POA: Diagnosis not present

## 2021-02-01 NOTE — Assessment & Plan Note (Signed)
Small what appears to be more of a ganglion cyst of the right wrist.  Discussed with patient icing regimen and home exercises.  Discussed with him changing ergonomics such as a vertical mouse.  If enlargement occurs can consider the possibility of aspiration but does have a close proximity to the artery that could be somewhat concerning.  Patient does state that it is getting better we will continue with conservative therapy.  I did show patient with home spica splint.  Could potentially help with wearing it at night.  Follow-up again in 6 to 8 weeks.

## 2021-02-01 NOTE — Patient Instructions (Signed)
Keep doing Voltaren and icing Vertical mouse at home Splint to wear at night See you again in a few months just in case

## 2021-02-12 NOTE — Addendum Note (Signed)
Addended by: Carmie Kanner on: 02/12/2021 12:51 PM   Modules accepted: Orders

## 2021-12-05 ENCOUNTER — Encounter: Payer: Self-pay | Admitting: Family Medicine

## 2021-12-06 NOTE — Progress Notes (Signed)
[facility] [place] Phone: [phone] Subjective:   Tammy [name], am serving as a scribe for Dr. Hulan Saas.  I'm seeing this patient by the request  of:  Nicoletta Dress, MD  CC: Neck and shoulder pain follow-up  [id number]  Tammy [name] is a 41 y.o. female coming in with complaint of R foot and neck pain. Saw Dr. Milinda Pointer for extensor tendon tendonitis and bunion. Patient states that she has tried chiro, PT for her neck. If patient rotates or bends C spine to the L she will have sharp pain in L side of neck. Symptoms occurring for past year and a half.   One year ago patient had insidious onset of a red,  raised bump on R first MTP. Went to prednisone and symptoms cleared. This year had similar swelling over midfoot in August which improved over 2 weeks with Voltaren gel.  Cervical xray June 2022 Impression: Endplate spurring at the C6-7 level. 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  Cervical MRI June 2023 At C5-6, mild posterior disc bulge with small right central disc protrusion results in mild canal stenosis abutting the right ventral spinal cord At C6-7, mild posterior disc bulge with small central disc protrusion results in minimal canal stenosis [name] high-grade canal stenosis or foraminal narrowing in the cervical spine    Past Medical History:  Diagnosis Date   Hypothyroidism    On Armour Thyroid   Past Surgical History:  Procedure Laterality Date   HIP SURGERY Left 11/1990   2 screws inserted   TONSILLECTOMY  10/89   Social History   Socioeconomic History   Marital status: Single    Spouse name: Not on file   Number of children: Not on file   Years of education: Not on file   Highest education level: Not on file  Occupational History   Occupation: Surveyor, 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    Employer: [employer]  Tobacco Use   Smoking status: Never    Smokeless tobacco: Never  Substance and Sexual Activity   Alcohol use: Not Currently    Comment: social   Drug use: [name]   Sexual activity: Not on file  Other Topics Concern   Not on file  Social History Narrative   Not on file   Social Determinants of Health   Financial Resource Strain: Not on file  Food Insecurity: Not on file  Transportation Needs: Not on file  Physical Activity: Not on file  Stress: Not on file  Social Connections: Not on file   Allergies  Allergen Reactions   Morphine Nausea And Vomiting   Penicillins Rash   Family History  Problem Relation Age of Onset   Diabetes Maternal Uncle    Hypertension Father    Gout Father     Current Outpatient Medications (Endocrine & Metabolic):    ARMOUR THYROID PO, Take by mouth. Alternates between '75mg'$  and 90 mg daily      Current Outpatient Medications (Other):    Dapsone 7.5 % GEL,    triamcinolone ointment (KENALOG) 0.1 %, Apply 1 application topically daily.   TURMERIC PO, Take 700 mg by mouth 2 (two) times daily.   Reviewed prior external information including notes and imaging from  primary care provider As well as notes that were available from care everywhere and other healthcare systems.  Past medical history, social, surgical and family history all reviewed in electronic medical record.  [name] pertanent information  unless stated regarding to the chief complaint.   Review of Systems:  [name] headache, visual changes, nausea, vomiting, diarrhea, constipation, dizziness, abdominal pain, skin rash, fevers, chills, night sweats, weight loss, swollen lymph nodes, body aches, joint swelling, chest pain, shortness of breath, mood changes. POSITIVE muscle aches  Objective  Blood pressure 116/72, pulse 72, height '5\' 5"'$  (1.651 m), weight 168 lb (76.2 kg), SpO2 99 %.   General: [name] apparent distress alert and oriented x3 mood and affect normal, dressed appropriately.  HEENT: Pupils equal, extraocular movements intact   Respiratory: Patient's speak in full sentences and does not appear short of breath  Cardiovascular: [name] lower extremity edema, non tender, [name] erythema  Neck exam does have some very mild loss of lordosis.  Some worsening pain with sidebending to the left but not with extension.  Negative Spurling's.  Patient though does have some scapular dyskinesis with inferior winging noted.  Patient does have significant anterior displacement noted.  Positive impingement with the shoulder laterally.  Full strength  [id number]; 15 additional minutes spent for Therapeutic exercises as stated in above notes.  This included exercises focusing on stretching, strengthening, with significant focus on eccentric aspects.   Long term goals include an improvement in range of motion, strength, endurance as well as avoiding reinjury. Patient's frequency would include in 1-2 times a day, 3-5 times a week for a duration of 6-12 weeks.  Exercises that included:  Basic scapular stabilization to include adduction and depression of scapula Scaption, focusing on proper movement and good control Internal and External rotation utilizing a theraband, with elbow tucked at side entire time Rows with theraband  Proper technique shown and discussed handout in great detail with ATC.  All questions were discussed and answered.      Impression and Recommendations:    The above documentation has been reviewed and is accurate and complete Lyndal Pulley, DO

## 2021-12-07 ENCOUNTER — Ambulatory Visit: Payer: BC Managed Care – PPO | Admitting: Family Medicine

## 2021-12-07 ENCOUNTER — Encounter: Payer: Self-pay | Admitting: Family Medicine

## 2021-12-07 ENCOUNTER — Ambulatory Visit: Payer: Self-pay

## 2021-12-07 VITALS — BP 116/72 | HR 72 | Ht 65.0 in | Wt 168.0 lb

## 2021-12-07 DIAGNOSIS — M79671 Pain in right foot: Secondary | ICD-10-CM

## 2021-12-07 DIAGNOSIS — G2589 Other specified extrapyramidal and movement disorders: Secondary | ICD-10-CM | POA: Diagnosis not present

## 2021-12-07 NOTE — Patient Instructions (Signed)
Scapular stabilization Stand on wall for 5 min a day See me in 7-8 weeks

## 2021-12-07 NOTE — Assessment & Plan Note (Signed)
Patient does have the MRI and it does show some mild posterior disc bulges noted but seems to be more right-sided nerve impingement with patient having more discomfort on the left side.  I believe that patient is having more scapular dyskinesis that is contributing and we discussed ergonomics, home exercises, which activities to do and which ones to avoid.  We discussed an adjustable standing desk and how this would be helpful as well.  Patient has worked with a Community education officer and we taught her some different exercises given handouts.  We will see how patient responds.  Follow-up again in 6 to 8 weeks

## 2021-12-10 ENCOUNTER — Encounter: Payer: Self-pay | Admitting: Family Medicine

## 2021-12-14 ENCOUNTER — Encounter: Payer: Self-pay | Admitting: Family Medicine

## 2022-01-07 ENCOUNTER — Telehealth: Payer: Self-pay | Admitting: Family Medicine

## 2022-01-07 NOTE — Telephone Encounter (Signed)
See MyChart conversation 12/17/2021. Pt called today to advise that the incorrect test "pulled over" in her chart is still there and she wants to be sure it gets removed.

## 2022-01-24 ENCOUNTER — Ambulatory Visit: Payer: BC Managed Care – PPO | Admitting: Family Medicine

## 2022-01-29 NOTE — Telephone Encounter (Signed)
Spoke to pt. There is a form that needs to be completed & signed by the pt to have this order removed from her chart. She will complete the form when she comes in for her appt on 02/20/22.

## 2022-01-29 NOTE — Telephone Encounter (Signed)
Patient called back stating that this has still not been corrected. Can you advise?

## 2022-02-18 NOTE — Progress Notes (Unsigned)
Tammy [name] [place] Phone: [phone] Subjective:    I'm seeing this patient by the request  of:  Nicoletta Dress, MD  CC: Neck pain  [id number]  12/07/2021 Patient does have the MRI and it does show some mild posterior disc bulges noted but seems to be more right-sided nerve impingement with patient having more discomfort on the left side.  I believe that patient is having more scapular dyskinesis that is contributing and we discussed ergonomics, home exercises, which activities to do and which ones to avoid.  We discussed an adjustable standing desk and how this would be helpful as well.  Patient has worked with a Community education officer and we taught her some different exercises given handouts.  We will see how patient responds.  Follow-up again in 6 to 8 weeks     Update 02/20/2022 Tammy [name] is a 41 y.o. female coming in with complaint of Right foot and scapular pain. Patient states that she is here for her neck pain improvement has been small but is going in the right direction, just wishes is would go faster. Foot pain is doing better and has no issues and shoulder is no longer an issue. Pain in neck is worse with lateral bend, left sided neck pain. Patient brought images on a CD to look at.   Attempted to review the MRI but unfortunately disc would not pull up.  Did review outside records dating back from March 2020.  With the last note from [facility] stating that she is not a surgical candidate and referred to pain management.  Patient did have the report of the MRI that did show a central disc protrusion at C5-C6 and disc bulging at C6-C7.  Patient has done formal physical therapy, seeing integrative medicine, I did review outside x-ray of the cervical spine from June 2022 that showed the same spurring noted at these similar levels.    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    Employer: [employer]  Tobacco Use   Smoking status: Never   Smokeless tobacco: Never  Substance and Sexual Activity   Alcohol use: Not Currently    Comment: social   Drug use: No   Sexual activity: Not on file  Other Topics Concern   Not on file  Social History Narrative   Not on file   Social Determinants of Health   Financial Resource Strain: Not on file  Food Insecurity: Not on file  Transportation Needs: Not on file  Physical Activity: Not on file  Stress: Not on file  Social Connections: Not on file   Allergies  Allergen Reactions   Morphine Nausea And Vomiting   Penicillins Rash   Family History  Problem Relation Age of Onset   Diabetes Maternal Uncle    Hypertension Father    Gout Father     Current Outpatient Medications (Endocrine & Metabolic):    ARMOUR THYROID PO, Take by mouth. Alternates between  $'75mg'U$  and 90 mg daily      Current Outpatient Medications (Other):    Dapsone 7.5 % GEL,    triamcinolone ointment (KENALOG) 0.1 %, Apply 1 application topically daily.   Reviewed prior external information including notes and imaging from  primary care provider As well as notes that were available from care everywhere and other healthcare systems.  As stated above  Past medical history, social, surgical and family history all reviewed in electronic medical record.  No pertanent information unless stated regarding to the chief complaint.   Review of Systems:  No headache, visual changes, nausea, vomiting, diarrhea, constipation, dizziness, abdominal pain, skin rash, fevers,  chills, night sweats, weight loss, swollen lymph nodes, body aches, joint swelling, chest pain, shortness of breath, mood changes. POSITIVE muscle aches  Objective  Blood pressure 124/82, pulse 74, height '5\' 5"'$  (1.651 m), weight 170 lb (77.1 kg), SpO2 99 %.   General: No apparent distress alert and oriented x3 mood and affect normal, dressed appropriately.  HEENT: Pupils equal, extraocular movements intact  Respiratory: Patient's speak in full sentences and does not appear short of breath  Patient is sitting relatively comfortable.  He does have more tightness noted in the occipital area bilaterally left greater than right.  Patient will be asked the last 5 degrees of flexion of the neck.  Tender to palpation more and that is the deferred strength testing.    Impression and Recommendations:

## 2022-02-20 ENCOUNTER — Ambulatory Visit: Payer: Self-pay

## 2022-02-20 ENCOUNTER — Ambulatory Visit: Payer: BC Managed Care – PPO | Admitting: Family Medicine

## 2022-02-20 VITALS — BP 124/82 | HR 74 | Ht 65.0 in | Wt 170.0 lb

## 2022-02-20 DIAGNOSIS — M5030 Other cervical disc degeneration, unspecified cervical region: Secondary | ICD-10-CM

## 2022-02-20 DIAGNOSIS — M79671 Pain in right foot: Secondary | ICD-10-CM | POA: Diagnosis not present

## 2022-02-20 NOTE — Assessment & Plan Note (Signed)
Discussed with patient degenerative disc disease. Discussed with patient that we have not seen significant progress but does have areas where the disc protrusion could be potentially contributing to some nerve impingement.  Patient is doing multiple rounds of formal physical therapy, we have attempted different medications and patient would like to try to avoid being on any medications long-term.  We discussed with patient that we can consider home exercises still.  I do feel for diagnostic and therapeutic purposes at this point an epidural would be significantly beneficial.  This will tell us if the neck is giving [place] trouble or if we should start considering further evaluation to a different referred pain.  Patient is in agreement with the plan.  Patient will consider the epidural and get back to [place].  Total time reviewing and discussing with patient 33 minutes

## 2022-02-20 NOTE — Patient Instructions (Addendum)
Good to see you  Consider the epidural Keep working on scapular posture Happy Holliday's If you do decide to do the epidural let us know  Contact us if you have any questions Follow up in 8-10 weeks

## 2022-02-21 NOTE — Addendum Note (Signed)
Addended by: Douglass Rivers T on: 02/21/2022 07:56 AM   Modules accepted: Orders

## 2022-02-25 ENCOUNTER — Encounter: Payer: Self-pay | Admitting: Family Medicine

## 2022-04-02 ENCOUNTER — Telehealth: Payer: Self-pay | Admitting: Cardiology

## 2022-04-02 ENCOUNTER — Telehealth: Payer: Self-pay | Admitting: Family Medicine

## 2022-04-02 NOTE — Telephone Encounter (Signed)
Response:  Hello,  I do think it could be removed.  I do not have access to remove it and was told it was being taken care of.  I am fine with that ultrasound disappearing from the patient chart  Thank you  Charlann Boxer  04/02/22 yk

## 2022-04-02 NOTE — Telephone Encounter (Signed)
Amendment request received on 04/02/22. Forwarded request to Hulan Saas for review. 04/02/22 yk

## 2022-04-12 NOTE — Progress Notes (Unsigned)
[facility] [place] Phone: [phone] Subjective:   Fontaine No, am serving as a scribe for Dr. Hulan Saas.  I'm seeing this patient by the request  of:  Nicoletta Dress, MD  CC: neck pain   [id number]  03/02/2022 Discussed with patient degenerative disc disease. Discussed with patient that we have not seen significant progress but does have areas where the disc protrusion could be potentially contributing to some nerve impingement.  Patient is doing multiple rounds of formal physical therapy, we have attempted different medications and patient would like to try to avoid being on any medications long-term.  We discussed with patient that we can consider home exercises still.  I do feel for diagnostic and therapeutic purposes at this point an epidural would be significantly beneficial.  This will tell us if the neck is giving [place] trouble or if we should start considering further evaluation to a different referred pain.  Patient is in agreement with the plan.  Patient will consider the epidural and get back to [place].  Total time reviewing and discussing with patient 33 minutes  Update 04/16/2022 Heatherly Stenner is a 42 y.o. female coming in with complaint of neck pain. Patient states that she has had some improvement from last visit. Considering injections in neck but would like to try conservative options.   Also c/o R hip pain since December after traveling. Pain was over lateral hip and groin. Pain in R groin has become more pronounced especially with FABER position. No pain with walking. Has had intermittent groin pain. Also notes tingling in the quad for past month especially with standing.       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    Employer: [employer]  Tobacco Use   Smoking status: Never   Smokeless tobacco: Never  Substance and Sexual Activity   Alcohol use: Not Currently    Comment: social   Drug use: No   Sexual activity: Not on file  Other Topics Concern   Not on file  Social History Narrative   Not on file   Social Determinants of Health   Financial Resource Strain: Not on file  Food Insecurity: Not on file  Transportation Needs: Not on file  Physical Activity: Not on file  Stress: Not on file  Social Connections: Not on file   Allergies  Allergen Reactions   Morphine Nausea And Vomiting   Penicillins Rash   Family History  Problem Relation Age of Onset   Diabetes Maternal Uncle    Hypertension Father    Gout Father     Current Outpatient Medications (Endocrine & Metabolic):    ARMOUR THYROID PO, Take by mouth. Alternates between '75mg'$  and 90 mg daily      Current Outpatient Medications (Other):    Dapsone 7.5 % GEL,    gabapentin (NEURONTIN) 100 MG capsule, Take 2 capsules (200 mg total) by mouth at bedtime.    Review  of Systems:  No headache, visual changes, nausea, vomiting, diarrhea, constipation, dizziness, abdominal pain, skin rash, fevers, chills, night sweats, weight loss, swollen lymph nodes, body aches, joint swelling, chest pain, shortness of breath, mood changes. POSITIVE muscle aches  Objective  Blood pressure 110/82, pulse 93, height '5\' 6"'$  (1.676 m), weight 173 lb (78.5 kg), SpO2 98 %.   General: No apparent distress alert and oriented x3 mood and affect normal, dressed appropriately.  HEENT: Pupils equal, extraocular movements intact  Respiratory: Patient's speak in full sentences and does not appear short of  breath  Cardiovascular: No lower extremity edema, non tender, no erythema  Neck exam shows still some loss of lordosis.  Patient is sitting relatively comfortably.  Patient you can tell has some mild limitation in sidebending and rotation to the right.  Deferred the rest of the exam. Low back exam seems to have some mild loss of lordosis.  Patient does have severe tightness with FABER test.  No pain with internal range of motion.  Severe tightness and thickness of the hip flexor tendon at its insertion on the thigh.  Negative fulcrum test.  Negative straight leg test noted today but patient states that there is significant more tightness on the right than the left.      Impression and Recommendations:     The above documentation has been reviewed and is accurate and complete Lyndal Pulley, DO

## 2022-04-16 ENCOUNTER — Ambulatory Visit (INDEPENDENT_AMBULATORY_CARE_PROVIDER_SITE_OTHER): Payer: BC Managed Care – PPO

## 2022-04-16 ENCOUNTER — Ambulatory Visit: Payer: BC Managed Care – PPO | Admitting: Family Medicine

## 2022-04-16 ENCOUNTER — Encounter: Payer: Self-pay | Admitting: Family Medicine

## 2022-04-16 VITALS — BP 110/82 | HR 93 | Ht 66.0 in | Wt 173.0 lb

## 2022-04-16 DIAGNOSIS — M545 Low back pain, unspecified: Secondary | ICD-10-CM | POA: Diagnosis not present

## 2022-04-16 DIAGNOSIS — M25551 Pain in right hip: Secondary | ICD-10-CM

## 2022-04-16 DIAGNOSIS — M5030 Other cervical disc degeneration, unspecified cervical region: Secondary | ICD-10-CM

## 2022-04-16 MED ORDER — GABAPENTIN 100 MG PO CAPS: 200.0000 mg | ORAL_CAPSULE | Freq: Every day | ORAL | 0 refills | Status: DC

## 2022-04-16 NOTE — Assessment & Plan Note (Signed)
Known degenerative disc disease as well.  We discussed with patient again.  Patient has seen other providers for this and everyone has discussed different treatment options including occipital nerve block, medial branch block, and epidural.  We discussed the pros and cons of these as well as he wants to further evaluation and treatment with these type of injections could also have.  We discussed different medications and with patient having some radicular symptoms in the L2 distribution we will start gabapentin 100 to 200 mg at night.  We discussed with patient that should do relatively well with this and hopefully no side effects.  Increase activity slowly otherwise.  Follow-up again in 6 to 8 weeks

## 2022-04-16 NOTE — Assessment & Plan Note (Signed)
New problem for this individual.  Significant tightness of the hip flexor that I think is contributing.  Patient has had difficulty with hip abductor weakness previously.  We discussed with patient about different exercises and patient work with Product/process development scientist.  Will get x-rays of the lumbar spine and the right hip to further evaluate for any bony abnormalities.  Concern with some radicular symptoms consistent with an L2 nerve root and will get patient to start gabapentin to see if this will be beneficial.  Follow-up with me again in 6 to 8 weeks

## 2022-04-16 NOTE — Patient Instructions (Addendum)
Xray on way out Do stretches for Hip flexors Gabapentin 100-'200mg'$  at night See you again in 5-6 weeks

## 2022-04-21 ENCOUNTER — Encounter: Payer: Self-pay | Admitting: Family Medicine

## 2022-06-04 ENCOUNTER — Ambulatory Visit: Payer: BC Managed Care – PPO | Admitting: Family Medicine

## 2022-07-16 NOTE — Progress Notes (Signed)
[name] [address] [place] [address] Phone: [phone] Subjective:   Tammy [name], am serving as a scribe for Dr. Antoine Primas.  I'm seeing this patient by the request  of:  [name], MD  CC: Right hip and neck pain  [id number]  04/16/2022 New problem for this individual. Significant tightness of the hip flexor that I think is contributing. Patient has had difficulty with hip abductor weakness previously. We discussed with patient about different exercises and patient work with Event organiser. Will get x-rays of the lumbar spine and the right hip to further evaluate for any bony abnormalities. Concern with some radicular symptoms consistent with an L2 nerve root and will get patient to start gabapentin to see if this will be beneficial. Follow-up with me again in 6 to 8 weeks   Known degenerative disc disease as well.  We discussed with patient again.  Patient has seen other providers for this and everyone has discussed different treatment options including occipital nerve block, medial branch block, and epidural.  We discussed the pros and cons of these as well as he wants to further evaluation and treatment with these type of injections could also have.  We discussed different medications and with patient having some radicular symptoms in the L2 distribution we will start gabapentin 100 to 200 mg at night.  We discussed with patient that should do relatively well with this and hopefully no side effects.  Increase activity slowly otherwise.  Follow-up again in 6 to 8 weeks      Update 07/18/2022 Tammy [name] is a 42 y.o. female coming in with complaint of R hip and cervical spine pain. Patient states that she is feeling improvement. She has done craniosacral therapy since last visit. Has had a few days without any pain due to therapy. Pain is also less intense.   Less numbness and tingling in R hip. Pain worse with FABER position. Pain in  groin and in the glute with this motion.  Patient still states that it is significantly better than what it was previously.     Past Medical History:  Diagnosis Date   Hypothyroidism    On Armour Thyroid   Past Surgical History:  Procedure Laterality Date   HIP SURGERY Left 11/1990   2 screws inserted   TONSILLECTOMY  10/89   Social History   Socioeconomic History   Marital status: Single    Spouse name: Not on file   Number of children: Not on file   Years of education: Not on file   Highest education level: Not on file  Occupational History   Occupation: Tour manager Department    Employer: [employer]  Tobacco Use   Smoking status: Never   Smokeless tobacco: Never  Substance and Sexual Activity   Alcohol use: Not Currently    Comment: social   Drug use: No   Sexual activity: Not on file  Other Topics Concern   Not on file  Social History Narrative   Not on file   Social Determinants of Health   Financial Resource Strain: Not on file  Food Insecurity: Not on file  Transportation Needs: Not on file  Physical Activity: Not on file  Stress: Not on file  Social Connections: Not on file   Allergies  Allergen Reactions   Morphine Nausea And Vomiting   Penicillins Rash   Family History  Problem Relation Age of Onset   Diabetes Maternal Uncle  Hypertension Father    Gout Father     Current Outpatient Medications (Endocrine & Metabolic):    Levothyroxine Sodium (TIROSINT) 50 MCG CAPS, Take by mouth daily before breakfast.   liothyronine (CYTOMEL) 5 MCG tablet, Take 5 mcg by mouth in the morning, at noon, and at bedtime.  Current Outpatient Medications (Cardiovascular):    minoxidil (LONITEN) 2.5 MG tablet, Take 1.5 mg by mouth daily.     Current Outpatient Medications (Other):    Crisaborole (EUCRISA) 2 % OINT, Apply topically.   Dapsone 7.5 % GEL,    Tretinoin Microsphere (RETIN-A MICRO PUMP) 0.06 % GEL, Apply topically.    Objective   Blood pressure 122/84, pulse 65, height 5\' 6"  (1.676 m), weight 172 lb (78 kg), SpO2 99 %.   General: No apparent distress alert and oriented x3 mood and affect normal, dressed appropriately.  HEENT: Pupils equal, extraocular movements intact  Respiratory: Patient's speak in full sentences and does not appear short of breath  Cardiovascular: No lower extremity edema, non tender, no erythema  Patient's neck exam does have some improvement in range of motion.  May be some very mild limitation with the sidebending to the left. Right hip great range of motion but some mild tightness with FABER on the right compared to the left.  Patient not able to get out of a chair without any significant difficulty.    Impression and Recommendations:    The above documentation has been reviewed and is accurate and complete Judi Saa, DO

## 2022-07-18 ENCOUNTER — Ambulatory Visit: Payer: BC Managed Care – PPO | Admitting: Family Medicine

## 2022-07-18 ENCOUNTER — Encounter: Payer: Self-pay | Admitting: Family Medicine

## 2022-07-18 VITALS — BP 122/84 | HR 65 | Ht 66.0 in | Wt 172.0 lb

## 2022-07-18 DIAGNOSIS — M5030 Other cervical disc degeneration, unspecified cervical region: Secondary | ICD-10-CM

## 2022-07-18 NOTE — Assessment & Plan Note (Addendum)
Patient has made significant improvement at this time.  We discussed icing regimen and home exercises, which activities to do and which ones to avoid.  Discussed with patient that she is doing so well that I think she will continue to do great.

## 2022-07-18 NOTE — Patient Instructions (Signed)
Stability ankle exercises Have appt in 3 months See me when you need me

## 2022-07-18 NOTE — Telephone Encounter (Signed)
Patient is following up on the status of having the [place] removed from 12/07/21.  Can you advise on this?

## 2022-07-29 NOTE — Telephone Encounter (Signed)
[place],  Could you help with this?  Patient completed an amendment request that was received at [facility] on 04/02/22 and it looks like Dr Katrinka Blazing noted it was okay to remove the ultrasound.

## 2022-08-01 NOTE — Telephone Encounter (Signed)
IT ticket placed.

## 2022-08-08 NOTE — Telephone Encounter (Signed)
Per IT, the image can not be removed.  Advised that Dr Katrinka Blazing add an addendum to note that the image was in error.  This has been done.

## 2022-10-15 NOTE — Progress Notes (Unsigned)
[name] [address] [place] [address] Phone: [phone] Subjective:   INadine Counts, am serving as a scribe for Dr. Antoine Primas.  I'm seeing this patient by the request  of:  Tammy Done, MD  CC: Neck pain follow-up  [id number]  07/18/2022 Patient has made significant improvement at this time.  We discussed icing regimen and home exercises, which activities to do and which ones to avoid.  Discussed with patient that she is doing so well that I think she will continue to do great.      Update 10/17/2022 Tammy [name] is a 42 y.o. female coming in with complaint of cervical spine DDD. Patient states following up on neck pain. About the same. Where can we go from here? Talk options. R hip and thigh has gotten better.  L foot about a month ago was crouching for long period and 2 toe were numb for about a week. 2nd and 3rd toe. Happens a little more often but doesn't last as long.      Past Medical History:  Diagnosis Date   Hypothyroidism    On Armour Thyroid   Past Surgical History:  Procedure Laterality Date   HIP SURGERY Left 11/1990   2 screws inserted   TONSILLECTOMY  10/89   Social History   Socioeconomic History   Marital status: Single    Spouse name: Not on file   Number of children: Not on file   Years of education: Not on file   Highest education level: Not on file  Occupational History   Occupation: Tour manager Department    Employer: [employer]  Tobacco Use   Smoking status: Never   Smokeless tobacco: Never  Substance and Sexual Activity   Alcohol use: Not Currently    Comment: social   Drug use: No   Sexual activity: Not on file  Other Topics Concern   Not on file  Social History Narrative   Not on file   Social Determinants of Health   Financial Resource Strain: Not on file  Food Insecurity: Not on file  Transportation Needs: Not on file  Physical Activity: Not on file  Stress: Not on file   Social Connections: Not on file   Allergies  Allergen Reactions   Morphine Nausea And Vomiting   Penicillins Rash   Family History  Problem Relation Age of Onset   Diabetes Maternal Uncle    Hypertension Father    Gout Father     Current Outpatient Medications (Endocrine & Metabolic):    Levothyroxine Sodium (TIROSINT) 50 MCG CAPS, Take by mouth daily before breakfast.   liothyronine (CYTOMEL) 5 MCG tablet, Take 5 mcg by mouth in the morning, at noon, and at bedtime.  Current Outpatient Medications (Cardiovascular):    minoxidil (LONITEN) 2.5 MG tablet, Take 1.5 mg by mouth daily.     Current Outpatient Medications (Other):    Crisaborole (EUCRISA) 2 % OINT, Apply topically.   Dapsone 7.5 % GEL,    Tretinoin Microsphere (RETIN-A MICRO PUMP) 0.06 % GEL, Apply topically.       Objective  Blood pressure 110/70, pulse 88, height 5\' 6"  (1.676 m), weight 169 lb (76.7 kg), SpO2 98%.   General: No apparent distress alert and oriented x3 mood and affect normal, dressed appropriately.  HEENT: Pupils equal, extraocular movements intact  Respiratory: Patient's speak in full sentences and does not appear short of breath  Cardiovascular: No lower extremity edema, non tender, no erythema  Neck exam does have some loss lordosis but is moving it appropriately while we sit there.  Patient is moving all extremities well.  Foot exam does show the patient does have breakdown of the transverse arch with bunionette formation noted over the fifth toe on the left foot.  Patient has good range of motion of the toes and cells.  Neurovascularly intact distally      Impression and Recommendations:    The above documentation has been reviewed and is accurate and complete Tammy Saa, DO

## 2022-10-17 ENCOUNTER — Encounter: Payer: Self-pay | Admitting: Family Medicine

## 2022-10-17 ENCOUNTER — Ambulatory Visit: Payer: BC Managed Care – PPO | Admitting: Family Medicine

## 2022-10-17 VITALS — BP 110/70 | HR 88 | Ht 66.0 in | Wt 169.0 lb

## 2022-10-17 DIAGNOSIS — M5030 Other cervical disc degeneration, unspecified cervical region: Secondary | ICD-10-CM | POA: Diagnosis not present

## 2022-10-17 DIAGNOSIS — M216X2 Other acquired deformities of left foot: Secondary | ICD-10-CM | POA: Insufficient documentation

## 2022-10-17 NOTE — Patient Instructions (Signed)
Do prescribed exercises at least 3x a week Spenco Total Orthotics Think about the neck See you again in 3 months

## 2022-10-17 NOTE — Assessment & Plan Note (Signed)
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  We discussed with patient again the imaging findings, the arthritic changes noted on x-ray and different treatment options.  We discussed the possibility of an epidural in the cervical spine.  Patient does have some hesitation and may want to discuss with the interventional radiologist since this is possible.  We discussed with her about the potential risk factors especially of bleeding and infectious etiology.  Patient would like to consider this still at this time.  Encouraged her to continue to stay active and continue the home exercises.  Patient knows we are here if there is any questions.  Otherwise can check in again in 3 months.

## 2022-10-17 NOTE — Assessment & Plan Note (Signed)
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  Discussed with patient about metatarsal pads and over-the-counter orthotics, proper shoes.  Discussed with patient about different exercises to strengthen arch where possible.  If any worsening pain can consider the possibility of ultrasound and potential injection but hopefully will not be necessary.  Follow-up again in 3 months

## 2022-11-04 ENCOUNTER — Encounter: Payer: Self-pay | Admitting: Family Medicine

## 2022-12-24 ENCOUNTER — Encounter: Payer: Self-pay | Admitting: Family Medicine

## 2023-01-21 NOTE — Progress Notes (Unsigned)
[name] [address] [place] [address] Phone: [phone] Subjective:   INadine Counts, am serving as a scribe for Dr. Antoine Primas.  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  Discussed with patient about metatarsal pads and over-the-counter orthotics, proper shoes.  Discussed with patient about different exercises to strengthen arch where possible.  If any worsening pain can consider the possibility of ultrasound and potential injection but hopefully will not be necessary.  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  We discussed with patient again the imaging findings, the arthritic changes noted on x-ray and different treatment options.  We discussed the possibility of an epidural in the cervical spine.  Patient does have some hesitation and may want to discuss with the interventional radiologist since this is possible.  We discussed with her about the potential risk factors especially of bleeding and infectious etiology.  Patient would like to consider this still at this time.  Encouraged her to continue to stay active and continue the home exercises.  Patient knows we are here if there is any questions.  Otherwise can check in again in 3 months.      Update 01/22/2023 Tammy [name] is a 42 y.o. female coming in with complaint of neck and L foot pain. Patient states neck has improved. Foot has gotten better, but now L ankle hurting. Has happened before and has been using voltaren. No other concerns.       Past Medical History:  Diagnosis Date   Hypothyroidism    On Armour Thyroid   Past Surgical History:  Procedure Laterality Date   HIP SURGERY Left 11/1990   2 screws inserted   TONSILLECTOMY  10/89   Social History    Socioeconomic History   Marital status: Single    Spouse name: Not on file   Number of children: Not on file   Years of education: Not on file   Highest education level: Not on file  Occupational History   Occupation: Tour manager Department    Employer: [employer]  Tobacco Use   Smoking status: Never   Smokeless tobacco: Never  Substance and Sexual Activity   Alcohol use: Not Currently    Comment: social   Drug use: No   Sexual activity: Not on file  Other Topics Concern   Not on file  Social History Narrative   Not on file   Social Determinants of Health   Financial Resource Strain: Not on file  Food Insecurity: Not on file  Transportation Needs: Not on file  Physical Activity: Not on file  Stress: Not on file  Social Connections: Not on file   Allergies  Allergen Reactions   Morphine Nausea And Vomiting   Penicillins Rash   Family History  Problem Relation Age of Onset   Diabetes Maternal Uncle    Hypertension Father    Gout Father     Current Outpatient Medications (Endocrine & Metabolic):    Levothyroxine Sodium (TIROSINT) 50 MCG CAPS, Take by mouth daily before breakfast.   liothyronine (CYTOMEL) 5 MCG tablet, Take 5 mcg by mouth in the morning, at noon, and at bedtime.  Current Outpatient Medications (Cardiovascular):    minoxidil (LONITEN) 2.5 MG tablet, Take 1.5 mg  by mouth daily.     Current Outpatient Medications (Other):    Crisaborole (EUCRISA) 2 % OINT, Apply topically.   Dapsone 7.5 % GEL,    Tretinoin Microsphere (RETIN-A MICRO PUMP) 0.06 % GEL, Apply topically.   Review of Systems:  No headache, visual changes, nausea, vomiting, diarrhea, constipation, dizziness, abdominal pain, skin rash, fevers, chills, night sweats, weight loss, swollen lymph nodes, body aches, joint swelling, chest pain, shortness of breath, mood changes. POSITIVE muscle aches but improving  Objective  Blood pressure 118/76, pulse 91, height 5\' 6"  (1.676  m), weight 169 lb (76.7 kg), SpO2 97%.   General: No apparent distress alert and oriented x3 mood and affect normal, dressed appropriately.  HEENT: Pupils equal, extraocular movements intact  Respiratory: Patient's speak in full sentences and does not appear short of breath  Cardiovascular: No lower extremity edema, non tender, no erythema  Right foot exam is unremarkable.  Left ankle has more pain over the tibia somewhat.  Seems to be more on the anterior medial aspect of the ankle.  Great range of motion though of the ankle.   Quick limited ultrasound did show that patient does have some hypoechoic changes of what appears to be an accessory anterior tibialis tendon.   Impression and Recommendations:    The above documentation has been reviewed and is accurate and complete Tammy Saa, DO

## 2023-01-23 ENCOUNTER — Ambulatory Visit: Payer: BC Managed Care – PPO | Admitting: Family Medicine

## 2023-01-23 ENCOUNTER — Encounter: Payer: Self-pay | Admitting: Family Medicine

## 2023-01-23 VITALS — BP 118/76 | HR 91 | Ht 66.0 in | Wt 169.0 lb

## 2023-01-23 DIAGNOSIS — M25572 Pain in left ankle and joints of left foot: Secondary | ICD-10-CM

## 2023-01-23 DIAGNOSIS — G8929 Other chronic pain: Secondary | ICD-10-CM | POA: Diagnosis not present

## 2023-01-23 NOTE — Assessment & Plan Note (Signed)
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  Ankle.  We discussed that this is a tertiary discussed with patient about icing regimen and home exercises.  Patient is doing well and we discussed proper shoe choices but otherwise the patient will follow-up in 3 months and should not have anything that should stop her from significant activity.

## 2023-01-23 NOTE — Patient Instructions (Signed)
Overall doing great Watch shoes choices Spenco Original or Slim Neck is doing great Good to see you!  See you again in 2-3 months

## 2023-01-28 ENCOUNTER — Encounter: Payer: Self-pay | Admitting: Family Medicine

## 2023-01-29 ENCOUNTER — Other Ambulatory Visit: Payer: Self-pay

## 2023-01-29 MED ORDER — NITROGLYCERIN 0.2 MG/HR TD PT24: MEDICATED_PATCH | TRANSDERMAL | 0 refills | Status: DC

## 2023-04-24 ENCOUNTER — Ambulatory Visit: Payer: BC Managed Care – PPO | Admitting: Family Medicine

## 2023-05-20 ENCOUNTER — Ambulatory Visit: Payer: BC Managed Care – PPO | Admitting: Podiatry

## 2023-05-20 ENCOUNTER — Encounter: Payer: Self-pay | Admitting: Podiatry

## 2023-05-20 VITALS — Ht 66.0 in | Wt 169.0 lb

## 2023-05-20 DIAGNOSIS — M76821 Posterior tibial tendinitis, right leg: Secondary | ICD-10-CM | POA: Diagnosis not present

## 2023-05-20 DIAGNOSIS — M7671 Peroneal tendinitis, right leg: Secondary | ICD-10-CM

## 2023-05-20 NOTE — Progress Notes (Signed)
   Chief Complaint  Patient presents with   Foot Orthotics    Pt is here to discuss getting new orthotics the ones she is currently wearing she has had them since 2017.    HPI: 43 y.o. female presenting today to discuss possibly getting a new pair of orthotics.  She is concerned that her orthotics are worn out.  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  She says the orthotics helped tremendously.  She has not had the orthotics for about 8 years now.  Past Medical History:  Diagnosis Date   Hypothyroidism    On Armour Thyroid    Past Surgical History:  Procedure Laterality Date   HIP SURGERY Left 11/1990   2 screws inserted   TONSILLECTOMY  10/89    Allergies  Allergen Reactions   Morphine Nausea And Vomiting   Penicillins Rash     Physical Exam: General: The patient is alert and oriented x3 in no acute distress.  Dermatology: Skin is warm, dry and supple bilateral lower extremities.  Hyperkeratotic callus with a central nucleated core noted to the plantar lateral aspect of the fifth MTP right foot  Vascular: Palpable pedal pulses bilaterally. Capillary refill within normal limits.  No appreciable edema.  No erythema.  Neurological: Grossly intact via light touch  Musculoskeletal Exam: Mild tailor's bunionette deformity noted to the right foot.  Currently the tendinitis traditionally to the foot is asymptomatic   Assessment/Plan of Care: 1.  History of posterior tibial tendinitis right; currently asymptomatic 2.  Generalized right foot pain  -Patient evaluated -Patient really came in today to inquire about the possibility of new orthotics.  Explained to the patient that overall her orthotics appear to be well-maintained and in decent shape.  They maintain the contour and arch of her foot nicely. -If the patient would like to pursue an additional pair of orthotics she can contact our office and schedule an appointment directly with the  orthotics department -Return to clinic with me as needed       Felecia Shelling, DPM [facility]  Dr. Felecia Shelling, DPM    [address] [place], [place] [address]                Office [phone]  Fax [phone]

## 2023-06-18 ENCOUNTER — Ambulatory Visit: Payer: BC Managed Care – PPO | Admitting: Family Medicine

## 2023-08-29 ENCOUNTER — Other Ambulatory Visit

## 2023-09-05 ENCOUNTER — Ambulatory Visit

## 2023-09-05 NOTE — Progress Notes (Signed)
 Orthotics   Patient was present and evaluated for Custom molded foot orthotics. Patient will benefit from CFO's to provide total contact to BIL MLA's helping to balance and distribute body weight more evenly across BIL feet helping to reduce plantar pressure and pain. Orthotic will also encourage FF / RF alignment  Patient was scanned today and will return for fitting upon receipt

## 2023-09-12 ENCOUNTER — Telehealth: Payer: Self-pay | Admitting: Podiatry

## 2023-09-12 NOTE — Telephone Encounter (Signed)
 Patient asks if the thickness / height of heel can be readjusted if needed after picking up orthotics.

## 2023-09-22 NOTE — Progress Notes (Unsigned)
 Darlyn [name] [facility] [address] [place] [address] Phone: [phone] Subjective:   Tammy [name], am serving as a scribe for Tammy [name].  I'm seeing this patient by the request  of:  Tammy Kay, MD  CC: Left ankle pain  YEP:Dlagzrupcz  Tammy [name] is a 43 y.o. female coming in with complaint of R foot pain. Last seen for L ankle pain in November 2024. Patient states spot on top of R foot. Red and tender and flared today. Has been using Voltaren . Take about 4-6 weeks for tenderness to go away and another month to completely leave. Happens on both feet.  Was seen by an outside facility and diagnosed with more of the posterior tibialis tendinitis of the right ankle as well as peroneal tendinitis.  Was going to get new orthotics but provider felt that they were still sufficient.    Past Medical History:  Diagnosis Date   Hypothyroidism    On Armour Thyroid    Past Surgical History:  Procedure Laterality Date   HIP SURGERY Left 11/1990   2 screws inserted   TONSILLECTOMY  10/89   Social History   Socioeconomic History   Marital status: Single    Spouse name: Not on file   Number of children: Not on file   Years of education: Not on file   Highest education level: Not on file  Occupational History   Occupation: Tour manager Department    Employer: [employer]  Tobacco Use   Smoking status: Never   Smokeless tobacco: Never  Substance and Sexual Activity   Alcohol use: Not Currently    Comment: social   Drug use: No   Sexual activity: Not on file  Other Topics Concern   Not on file  Social History Narrative   Not on file   Social Drivers of Health   Financial Resource Strain: Not on file  Food Insecurity: Not on file  Transportation Needs: Not on file  Physical Activity: Not on file  Stress: Not on file  Social Connections: Not on file   Allergies  Allergen Reactions   Morphine Nausea And Vomiting   Penicillins Rash    Family History  Problem Relation Age of Onset   Diabetes Maternal Uncle    Hypertension Father    Gout Father     Current Outpatient Medications (Endocrine & Metabolic):    Levothyroxine Sodium (TIROSINT) 50 MCG CAPS, Take by mouth daily before breakfast.  Current Outpatient Medications (Cardiovascular):    minoxidil (LONITEN) 2.5 MG tablet, Take 1.5 mg by mouth daily.     Current Outpatient Medications (Other):    Crisaborole (EUCRISA) 2 % OINT, Apply topically.   Dapsone 7.5 % GEL,    Tretinoin Microsphere (RETIN-A MICRO PUMP) 0.06 % GEL, Apply topically.   Objective  Blood pressure 110/74, pulse 76, height 5' 6 (1.676 m), weight 171 lb (77.6 kg), SpO2 98%.   General: No apparent distress alert and oriented x3 mood and affect normal, dressed appropriately.  HEENT: Pupils equal, extraocular movements intact  Respiratory: Patient's speak in full sentences and does not appear short of breath  Cardiovascular: No lower extremity edema, non tender, no erythema  Ankle exam shows patient minimally tender.  Right foot does have a bruising or discoloration noted on the dorsal aspect of the foot mostly between the 2nd and 3rd toes on the midfoot.  Minimal tenderness on exam today.   Limited muscular skeletal ultrasound was performed and interpreted by [name],  Tammy [name], M   Ultrasound was independently visualized by me showing what appears to be more of a vein that was recently irritated.  Fully compressible.  No sign of any clot.  No cortical irregularity or any hypoechoic changes of the midfoot itself   Impression and Recommendations:    The above documentation has been reviewed and is accurate and complete Tammy Mabile M Angellee Cohill, DO

## 2023-09-23 ENCOUNTER — Telehealth: Payer: Self-pay

## 2023-09-23 ENCOUNTER — Ambulatory Visit: Admitting: Family Medicine

## 2023-09-23 ENCOUNTER — Encounter: Payer: Self-pay | Admitting: Family Medicine

## 2023-09-23 ENCOUNTER — Other Ambulatory Visit: Payer: Self-pay

## 2023-09-23 VITALS — BP 110/74 | HR 76 | Ht 66.0 in | Wt 171.0 lb

## 2023-09-23 DIAGNOSIS — M79671 Pain in right foot: Secondary | ICD-10-CM | POA: Diagnosis not present

## 2023-09-23 NOTE — Telephone Encounter (Signed)
 Orthotics placed in [employer] 09/23/23

## 2023-09-23 NOTE — Assessment & Plan Note (Addendum)
 Right foot pain is still in the dorsal aspect.  With ultrasound seems to be that this was a potential ruptured superficial vein.  Patient has had difficulties with platelets that could be contributing to this as well.  Discussed use of padded socks for the possibility of different timing of shoes.  Do believe though that patient should do relatively well overall.  Follow-up with me as needed

## 2023-09-23 NOTE — Patient Instructions (Addendum)
 Ruptured vein Keep taking Vit C Consider more padded socks with tight fitting shoes Or retie shoes differently Try Arnica after 1st 3 days of Voltaren  See me when you need me

## 2023-10-10 ENCOUNTER — Ambulatory Visit

## 2023-10-10 NOTE — Progress Notes (Signed)
 Taking orthotics to [place] to thin out blue foam making thinner patient cannot tolerate her shoe too tight on in-step wears clog style shoe no laces to adjust  Lolita Schultze CPed, CFo, CFm

## 2023-10-24 ENCOUNTER — Ambulatory Visit (INDEPENDENT_AMBULATORY_CARE_PROVIDER_SITE_OTHER)

## 2023-10-24 DIAGNOSIS — M76821 Posterior tibial tendinitis, right leg: Secondary | ICD-10-CM | POA: Diagnosis not present

## 2023-10-24 DIAGNOSIS — M7671 Peroneal tendinitis, right leg: Secondary | ICD-10-CM | POA: Diagnosis not present

## 2023-10-24 DIAGNOSIS — M2142 Flat foot [pes planus] (acquired), left foot: Secondary | ICD-10-CM | POA: Diagnosis not present

## 2023-10-24 DIAGNOSIS — M2141 Flat foot [pes planus] (acquired), right foot: Secondary | ICD-10-CM | POA: Diagnosis not present

## 2023-10-24 NOTE — Progress Notes (Signed)
 Patient presents today to pick up custom molded foot orthotics, diagnosed with Peroneal tendonitis by Dr. Janit.   Orthotics were dispensed and fit was satisfactory. Reviewed instructions for break-in and wear. Written instructions given to patient.  Patient will follow up as needed.   Lolita Schultze Cped CFo, CFm

## 2023-11-12 ENCOUNTER — Ambulatory Visit

## 2023-12-16 NOTE — Progress Notes (Signed)
 Patient was here and adjustments were made if patient would like I can send back and retop with ucolite non perforated
